# Patient Record
Sex: Female | Born: 1980 | Race: White | Hispanic: Refuse to answer | Marital: Single | Smoking: Former smoker
Health system: Southern US, Community
[De-identification: ages and names within clinical notes are randomized; demographics above are authoritative.]

## PROBLEM LIST (undated history)

## (undated) ENCOUNTER — Inpatient Hospital Stay: Payer: Self-pay

## (undated) DIAGNOSIS — K5090 Crohn's disease, unspecified, without complications: Secondary | ICD-10-CM

## (undated) DIAGNOSIS — N159 Renal tubulo-interstitial disease, unspecified: Secondary | ICD-10-CM

## (undated) DIAGNOSIS — K5790 Diverticulosis of intestine, part unspecified, without perforation or abscess without bleeding: Secondary | ICD-10-CM

---
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
## 2005-02-07 ENCOUNTER — Inpatient Hospital Stay: Payer: Self-pay | Admitting: Unknown Physician Specialty

## 2005-02-14 ENCOUNTER — Emergency Department: Payer: Self-pay | Admitting: Emergency Medicine

## 2005-12-06 ENCOUNTER — Emergency Department: Payer: Self-pay | Admitting: Unknown Physician Specialty

## 2008-07-06 ENCOUNTER — Emergency Department: Payer: Self-pay | Admitting: Emergency Medicine

## 2013-01-08 ENCOUNTER — Emergency Department: Payer: Self-pay | Admitting: Emergency Medicine

## 2013-01-08 LAB — COMPREHENSIVE METABOLIC PANEL
Albumin: 3.4 g/dL (ref 3.4–5.0)
Alkaline Phosphatase: 87 U/L (ref 50–136)
BUN: 14 mg/dL (ref 7–18)
Bilirubin,Total: 0.2 mg/dL (ref 0.2–1.0)
Creatinine: 0.96 mg/dL (ref 0.60–1.30)
EGFR (African American): >60
Glucose: 81 mg/dL (ref 65–99)
Potassium: 3.9 mmol/L (ref 3.5–5.1)
SGOT(AST): 15 U/L (ref 15–37)
Sodium: 141 mmol/L (ref 136–145)

## 2013-01-08 LAB — URINALYSIS, COMPLETE
Bilirubin,UR: NEGATIVE
Glucose,UR: NEGATIVE mg/dL (ref 0–75)
Ketone: NEGATIVE
Nitrite: NEGATIVE
RBC,UR: <1 /HPF (ref 0–5)
Specific Gravity: 1.021 (ref 1.003–1.030)
WBC UR: <1 /HPF (ref 0–5)

## 2013-01-08 LAB — CBC
HCT: 38.4 % (ref 35.0–47.0)
HGB: 12.7 g/dL (ref 12.0–16.0)
MCH: 30.8 pg (ref 26.0–34.0)
MCHC: 33.2 g/dL (ref 32.0–36.0)
Platelet: 268 10*3/uL (ref 150–440)
RBC: 4.13 10*6/uL (ref 3.80–5.20)

## 2013-01-09 LAB — PREGNANCY, URINE: Pregnancy Test, Urine: NEGATIVE m[IU]/mL

## 2013-04-01 ENCOUNTER — Emergency Department: Payer: Self-pay | Admitting: Emergency Medicine

## 2013-04-01 LAB — CBC
MCH: 31.4 pg (ref 26.0–34.0)
MCHC: 33.7 g/dL (ref 32.0–36.0)
MCV: 93 fL (ref 80–100)
RBC: 4.18 10*6/uL (ref 3.80–5.20)
RDW: 13.1 % (ref 11.5–14.5)

## 2013-04-01 LAB — URINALYSIS, COMPLETE
Blood: NEGATIVE
Ketone: NEGATIVE
Leukocyte Esterase: NEGATIVE
Nitrite: NEGATIVE
RBC,UR: <1 /HPF (ref 0–5)
Squamous Epithelial: 1
WBC UR: 7 /HPF (ref 0–5)

## 2013-04-01 LAB — COMPREHENSIVE METABOLIC PANEL
Albumin: 3.5 g/dL (ref 3.4–5.0)
Anion Gap: 7 (ref 7–16)
BUN: 13 mg/dL (ref 7–18)
Calcium, Total: 8.8 mg/dL (ref 8.5–10.1)
Creatinine: 0.88 mg/dL (ref 0.60–1.30)
Sodium: 137 mmol/L (ref 136–145)
Total Protein: 7.1 g/dL (ref 6.4–8.2)

## 2013-04-01 LAB — HCG, QUANTITATIVE, PREGNANCY: Beta Hcg, Quant.: 37421 m[IU]/mL — ABNORMAL HIGH

## 2013-04-03 LAB — URINE CULTURE

## 2013-04-21 ENCOUNTER — Emergency Department: Payer: Self-pay | Admitting: Emergency Medicine

## 2013-04-21 LAB — CBC
HCT: 35.6 % (ref 35.0–47.0)
HGB: 12.4 g/dL (ref 12.0–16.0)
MCHC: 34.8 g/dL (ref 32.0–36.0)
MCV: 93 fL (ref 80–100)
Platelet: 269 10*3/uL (ref 150–440)
RDW: 13.2 % (ref 11.5–14.5)
WBC: 7.6 10*3/uL (ref 3.6–11.0)

## 2013-04-21 LAB — URINALYSIS, COMPLETE
Bilirubin,UR: NEGATIVE
Glucose,UR: NEGATIVE mg/dL (ref 0–75)
Ketone: NEGATIVE
Leukocyte Esterase: NEGATIVE
Nitrite: NEGATIVE
Ph: 8 (ref 4.5–8.0)
RBC,UR: <1 /HPF (ref 0–5)
Specific Gravity: 1.011 (ref 1.003–1.030)
WBC UR: 1 /HPF (ref 0–5)

## 2013-04-21 LAB — COMPREHENSIVE METABOLIC PANEL
Albumin: 3.1 g/dL — ABNORMAL LOW (ref 3.4–5.0)
Anion Gap: 8 (ref 7–16)
BUN: 8 mg/dL (ref 7–18)
Chloride: 105 mmol/L (ref 98–107)
Co2: 24 mmol/L (ref 21–32)
Creatinine: 0.52 mg/dL — ABNORMAL LOW (ref 0.60–1.30)
EGFR (African American): >60
EGFR (Non-African Amer.): >60
Glucose: 79 mg/dL (ref 65–99)
Total Protein: 6.5 g/dL (ref 6.4–8.2)

## 2013-04-21 LAB — GC/CHLAMYDIA PROBE AMP

## 2013-06-17 DIAGNOSIS — O09899 Supervision of other high risk pregnancies, unspecified trimester: Secondary | ICD-10-CM | POA: Insufficient documentation

## 2013-08-14 DIAGNOSIS — E669 Obesity, unspecified: Secondary | ICD-10-CM | POA: Insufficient documentation

## 2013-09-04 ENCOUNTER — Observation Stay: Payer: Self-pay | Admitting: Obstetrics and Gynecology

## 2013-09-04 LAB — URINALYSIS, COMPLETE
Bilirubin,UR: NEGATIVE
Blood: NEGATIVE
GLUCOSE, UR: NEGATIVE mg/dL (ref 0–75)
Ketone: NEGATIVE
Leukocyte Esterase: NEGATIVE
Nitrite: NEGATIVE
PROTEIN: NEGATIVE
Ph: 7 (ref 4.5–8.0)
RBC,UR: <1 /HPF (ref 0–5)
Specific Gravity: 1.01 (ref 1.003–1.030)
Squamous Epithelial: 1

## 2014-12-17 ENCOUNTER — Encounter: Payer: Self-pay | Admitting: Emergency Medicine

## 2014-12-17 ENCOUNTER — Emergency Department
Admission: EM | Admit: 2014-12-17 | Discharge: 2014-12-18 | Disposition: A | Discharge status: Home / Self Care | Payer: Medicaid Other | Attending: Emergency Medicine | Admitting: Emergency Medicine

## 2014-12-17 DIAGNOSIS — Z720 Tobacco use: Secondary | ICD-10-CM | POA: Insufficient documentation

## 2014-12-17 DIAGNOSIS — L089 Local infection of the skin and subcutaneous tissue, unspecified: Secondary | ICD-10-CM | POA: Diagnosis not present

## 2014-12-17 DIAGNOSIS — L988 Other specified disorders of the skin and subcutaneous tissue: Secondary | ICD-10-CM | POA: Diagnosis present

## 2014-12-17 DIAGNOSIS — B9689 Other specified bacterial agents as the cause of diseases classified elsewhere: Secondary | ICD-10-CM

## 2014-12-17 DIAGNOSIS — R110 Nausea: Secondary | ICD-10-CM | POA: Diagnosis not present

## 2014-12-17 MED ORDER — SULFAMETHOXAZOLE-TRIMETHOPRIM 800-160 MG PO TABS: 1.0000 | ORAL_TABLET | Freq: Two times a day (BID) | ORAL | Status: DC

## 2014-12-17 MED ORDER — HYDROMORPHONE HCL 1 MG/ML IJ SOLN
1.0000 mg | Freq: Once | INTRAMUSCULAR | Status: AC
  Administered 2014-12-17: 1 mg via INTRAMUSCULAR

## 2014-12-17 MED ORDER — SULFAMETHOXAZOLE-TRIMETHOPRIM 800-160 MG PO TABS
1.0000 | ORAL_TABLET | Freq: Once | ORAL | Status: AC
  Administered 2014-12-17: 1 via ORAL

## 2014-12-17 MED ORDER — SULFAMETHOXAZOLE-TRIMETHOPRIM 800-160 MG PO TABS
ORAL_TABLET | ORAL | Status: AC
Start: 2014-12-17 — End: 2014-12-17
  Administered 2014-12-17: 1 via ORAL
  Filled 2014-12-17: qty 1

## 2014-12-17 MED ORDER — OXYCODONE-ACETAMINOPHEN 7.5-325 MG PO TABS: 1.0000 | ORAL_TABLET | ORAL | Status: AC | PRN

## 2014-12-17 MED ORDER — HYDROMORPHONE HCL 1 MG/ML IJ SOLN
INTRAMUSCULAR | Status: AC
  Administered 2014-12-17: 1 mg via INTRAMUSCULAR
  Filled 2014-12-17: qty 1

## 2014-12-17 NOTE — ED Provider Notes (Signed)
[facility] Emergency Department Provider Note  ____________________________________________  Time seen: Approximately 11:22 PM  I have reviewed the triage vital signs and the nursing notes.   HISTORY  Chief Complaint Abscess    HPI Penny Hardy is a 34 y.o. female complain of a nausea lesion medial aspect the left thigh. Patient stated onset yesterday with increasing pain and redness. Patient denies any discharge from the area. Patient denies any fever. Denies any inguinal adenopathy. Patient is rating the pain as a 9/10. Patient state increased pain with ambulation secondary to irritation from clothing and skin. Patient stated no palliative measures taken for this complaint.   No past medical history on file.  There are no active problems to display for this patient.   No past surgical history on file.  No current outpatient prescriptions on file.  Allergies Asa  No family history on file.  Social History History  Substance Use Topics  . Smoking status: Current Every Day Smoker    Types: Cigarettes  . Smokeless tobacco: Not on file  . Alcohol Use: No    Review of Systems Constitutional: No fever/chills Eyes: No visual changes. ENT: No sore throat. Cardiovascular: Denies chest pain. Respiratory: Denies shortness of breath. Gastrointestinal: No abdominal pain.  No nausea, no vomiting.  No diarrhea.  No constipation. Genitourinary: Negative for dysuria. Musculoskeletal: Negative for back pain. Skin: Nausea lesion medial left thigh. Neurological: Negative for headaches, focal weakness or numbness.  10-point ROS otherwise negative.  ____________________________________________   PHYSICAL EXAM:  VITAL SIGNS: ED Triage Vitals  Enc Vitals Group     BP 12/17/14 2153 138/88 mmHg     Pulse Rate 12/17/14 2153 94     Resp 12/17/14 2153 18     Temp 12/17/14 2153 97.5 F (36.4 C)     Temp src --      SpO2 12/17/14 2153 98 %   Weight 12/17/14 2153 195 lb (88.451 kg)     Height 12/17/14 2153 5' 2"  (1.575 m)     Head Cir --      Peak Flow --      Pain Score 12/17/14 2154 9     Pain Loc --      Pain Edu? --      Excl. in [place]? --     Constitutional: Alert and oriented. En route distress Eyes: Conjunctivae are normal. PERRL. EOMI. Head: Atraumatic. Nose: No congestion/rhinnorhea. Mouth/Throat: Mucous membranes are moist.  Oropharynx non-erythematous. Neck: No stridor.   Hematological/Lymphatic/Immunilogical: No cervical lymphadenopathy. Cardiovascular: Normal rate, regular rhythm. Grossly normal heart sounds.  Good peripheral circulation. Respiratory: Normal respiratory effort.  No retractions. Lungs CTAB. Gastrointestinal: Soft and nontender. No distention. No abdominal bruits. No CVA tenderness. Musculoskeletal: No lower extremity tenderness nor edema.  No joint effusions. Neurologic:  Normal speech and language. No gross focal neurologic deficits are appreciated. Speech is normal. No gait instability. Skin:  Skin is warm, dry and intact. No rash noted. Erythematous nodule lesion medial left thigh. Tender to palpation. No drainage. Area is nonfluctuant. Psychiatric: Mood and affect are normal. Speech and behavior are normal.  ____________________________________________   LABS (all labs ordered are listed, but only abnormal results are displayed)  Labs Reviewed - No data to display ____________________________________________  EKG   ____________________________________________  RADIOLOGY   ____________________________________________   PROCEDURES  Procedure(s) performed: None  Critical Care performed: No  ____________________________________________   INITIAL IMPRESSION / ASSESSMENT AND PLAN / ED COURSE  Pertinent labs & imaging results  that were available during my care of the patient were reviewed by me and considered in my medical decision making (see chart for details).  Bacterial  skin infection. ____________________________________________   FINAL CLINICAL IMPRESSION(S) / ED DIAGNOSES  Final diagnoses:  Skin infection, bacterial       Sable Feil, PA-C 12/17/14 7915  Lisa Roca, MD 12/17/14 [phone]

## 2014-12-17 NOTE — ED Notes (Signed)
Patient states that she had a "knot" that developed to her left groin yesterday.

## 2014-12-18 MED ORDER — ONDANSETRON 4 MG PO TBDP
4.0000 mg | ORAL_TABLET | Freq: Once | ORAL | Status: AC
Start: 2014-12-18 — End: 2014-12-18
  Administered 2014-12-18: 4 mg via ORAL

## 2014-12-18 MED ORDER — ONDANSETRON 4 MG PO TBDP
ORAL_TABLET | ORAL | Status: AC
Start: 2014-12-18 — End: 2014-12-18
  Administered 2014-12-18: 4 mg via ORAL
  Filled 2014-12-18: qty 1

## 2014-12-18 NOTE — ED Notes (Signed)
Patient with no complaints at this time. Respirations even and unlabored. Skin warm/dry. Discharge instructions reviewed with patient at this time. Patient given opportunity to voice concerns/ask questions. Patient discharged at this time and left Emergency Department, via wheelchair.

## 2014-12-18 NOTE — ED Notes (Signed)
Pt states nausea sx continue. Informed pt of current pain medication side effects and MD awareness of current sx. Pt acknowledged and wants to leave ER medical facility at this time.

## 2014-12-18 NOTE — ED Notes (Signed)
Made MD aware of pt feeling nauseated at this time. MD acknowledged and received new orders.

## 2015-04-01 ENCOUNTER — Emergency Department
Admission: EM | Admit: 2015-04-01 | Discharge: 2015-04-01 | Disposition: A | Discharge status: Home / Self Care | Payer: Medicaid Other | Attending: Emergency Medicine | Admitting: Emergency Medicine

## 2015-04-01 ENCOUNTER — Emergency Department: Payer: Medicaid Other

## 2015-04-01 ENCOUNTER — Encounter: Payer: Self-pay | Admitting: Emergency Medicine

## 2015-04-01 DIAGNOSIS — O039 Complete or unspecified spontaneous abortion without complication: Secondary | ICD-10-CM | POA: Insufficient documentation

## 2015-04-01 DIAGNOSIS — Z87891 Personal history of nicotine dependence: Secondary | ICD-10-CM | POA: Insufficient documentation

## 2015-04-01 DIAGNOSIS — Z79899 Other long term (current) drug therapy: Secondary | ICD-10-CM | POA: Diagnosis not present

## 2015-04-01 DIAGNOSIS — Z3A01 Less than 8 weeks gestation of pregnancy: Secondary | ICD-10-CM | POA: Insufficient documentation

## 2015-04-01 DIAGNOSIS — O209 Hemorrhage in early pregnancy, unspecified: Secondary | ICD-10-CM | POA: Diagnosis present

## 2015-04-01 LAB — ABO/RH: ABO/RH(D): A POS

## 2015-04-01 LAB — HCG, QUANTITATIVE, PREGNANCY: hCG, Beta Chain, Quant, S: 38157 m[IU]/mL — ABNORMAL HIGH (ref <5)

## 2015-04-01 LAB — CBC
HCT: 39.2 % (ref 35.0–47.0)
Hemoglobin: 13 g/dL (ref 12.0–16.0)
MCH: 30.6 pg (ref 26.0–34.0)
MCHC: 33.2 g/dL (ref 32.0–36.0)
MCV: 92.2 fL (ref 80.0–100.0)
PLATELETS: 253 10*3/uL (ref 150–440)
RBC: 4.25 MIL/uL (ref 3.80–5.20)
RDW: 13.4 % (ref 11.5–14.5)
WBC: 7.8 10*3/uL (ref 3.6–11.0)

## 2015-04-01 MED ORDER — HYDROCODONE-ACETAMINOPHEN 5-325 MG PO TABS: 1.0000 | ORAL_TABLET | ORAL | Status: DC | PRN

## 2015-04-01 MED ORDER — HYDROCODONE-ACETAMINOPHEN 5-325 MG PO TABS
1.0000 | ORAL_TABLET | Freq: Once | ORAL | Status: AC
  Administered 2015-04-01: 1 via ORAL
  Filled 2015-04-01: qty 1

## 2015-04-01 NOTE — ED Provider Notes (Signed)
[facility] Emergency Department Provider Note  Time seen: 9:48 AM  I have reviewed the triage vital signs and the nursing notes.   HISTORY  Chief Complaint Vaginal Bleeding    HPI Penny Hardy is a 34 y.o. female G5 P3 A1 presents the emergency department approximately 5-[redacted] weeks pregnant with vaginal bleeding. According to the patient her last period was 02/17/15. This morning she awoke with vaginal bleeding which she describes as being heavier than a period. Patient also passed what appears to be tissue which she brought to the emergency department. Patient denies any abdominal pain.     History reviewed. No pertinent past medical history.  There are no active problems to display for this patient.   No past surgical history on file.  Current Outpatient Rx  Name  Route  Sig  Dispense  Refill  . oxyCODONE-acetaminophen (PERCOCET) 7.5-325 MG per tablet   Oral   Take 1 tablet by mouth every 4 (four) hours as needed for severe pain.   20 tablet   0   . sulfamethoxazole-trimethoprim (BACTRIM DS,SEPTRA DS) 800-160 MG per tablet   Oral   Take 1 tablet by mouth 2 (two) times daily.   20 tablet   0     Allergies Asa  No family history on file.  Social History Social History  Substance Use Topics  . Smoking status: Former Smoker    Types: Cigarettes  . Smokeless tobacco: None  . Alcohol Use: No    Review of Systems Constitutional: Negative for fever. Cardiovascular: Negative for chest pain. Respiratory: Negative for shortness of breath. Gastrointestinal: Negative for abdominal pain Genitourinary: Positive for vaginal bleeding. Neurological: Negative for headache 10-point ROS otherwise negative.  ____________________________________________   PHYSICAL EXAM:  VITAL SIGNS: ED Triage Vitals  Enc Vitals Group     BP 04/01/15 0914 109/59 mmHg     Pulse Rate 04/01/15 0914 97     Resp 04/01/15 0914 16     Temp 04/01/15 0914 97.5 F  (36.4 C)     Temp Source 04/01/15 0914 Oral     SpO2 04/01/15 0914 97 %     Weight 04/01/15 0914 190 lb (86.183 kg)     Height 04/01/15 0914 5' 2"  (1.575 m)     Head Cir --      Peak Flow --      Pain Score 04/01/15 0914 10     Pain Loc --      Pain Edu? --      Excl. in [place]? --     Constitutional: Alert and oriented. Well appearing and in no distress. Eyes: Normal exam ENT   Mouth/Throat: Mucous membranes are moist. Cardiovascular: Normal rate, regular rhythm. No murmur Respiratory: Normal respiratory effort without tachypnea nor retractions. Breath sounds are clear and equal bilaterally. No wheezes/rales/rhonchi. Gastrointestinal: Soft and nontender. No distention.   Musculoskeletal: Nontender with normal range of motion in all extremities. Neurologic:  Normal speech and language. No gross focal neurologic deficits Psychiatric: Mood and affect are normal. Speech and behavior are normal. Patient exhibits appropriate insight and judgment.  ____________________________________________   RADIOLOGY  Ultrasound shows no gestational sac or fetus.  ____________________________________________   INITIAL IMPRESSION / ASSESSMENT AND PLAN / ED COURSE  Pertinent labs & imaging results that were available during my care of the patient were reviewed by me and considered in my medical decision making (see chart for details).  Patient is approximately 5-[redacted] weeks pregnant who presents with vaginal  bleeding. We will check labs and obtain an ultrasound. Patient did appear to pass tissue which she brought to the emergency department, which is possibly consistent with gestational sac. Patient denies any abdominal pain. Nontender abdominal exam.  Ultrasound shows no gestational sac or fetus, labs show beta hCG 38,000, patient is a positive blood type. Given the patient's heavy bleeding with tissue passage which appears to resemble a gestational sign, and an ultrasound showing no gestational  sac, likely miscarriage. We will have patient follow-up with GYN for repeat beta hCG testing.  ____________________________________________   FINAL CLINICAL IMPRESSION(S) / ED DIAGNOSES  Miscarriage  Harvest Dark, MD 04/01/15 1227

## 2015-04-01 NOTE — Discharge Instructions (Signed)
Please follow-up with your OB/GYN or primary care physician for repeat beta hCG testing in 2-3 days. Today's beta hCG is 38,000. Return to the emergency department for any abdominal pain, lightheadedness/dizziness, or any other symptom personally concerning to yourself.   Miscarriage A miscarriage is the loss of an unborn baby (fetus) before the 20th week of pregnancy. The cause is often unknown.  HOME CARE  You may need to stay in bed (bed rest), or you may be able to do light activity. Go about activity as told by your doctor.  Have help at home.  Write down how many pads you use each day. Write down how soaked they are.  Do not use tampons. Do not wash out your vagina (douche) or have sex (intercourse) until your doctor approves.  Only take medicine as told by your doctor.  Do not take aspirin.  Keep all doctor visits as told.  If you or your partner have problems with grieving, talk to your doctor. You can also try counseling. Give yourself time to grieve before trying to get pregnant again. GET HELP RIGHT AWAY IF:  You have bad cramps or pain in your back or belly (abdomen).  You have a fever.  You pass large clumps of blood (clots) from your vagina that are walnut-sized or larger. Save the clumps for your doctor to see.  You pass large amounts of tissue from your vagina. Save the tissue for your doctor to see.  You have more bleeding.  You have thick, bad-smelling fluid (discharge) coming from the vagina.  You get lightheaded, weak, or you pass out (faint).  You have chills. MAKE SURE YOU:  Understand these instructions.  Will watch your condition.  Will get help right away if you are not doing well or get worse. Document Released: 09/18/2011 Document Reviewed: 09/18/2011 [facility] Patient Information 2015 [place]. This information is not intended to replace advice given to you by your health care provider. Make sure you discuss any questions you have with  your health care provider.

## 2015-04-01 NOTE — ED Notes (Signed)
Per Patient bleeding started around 7:30am this morning.  Per patient she passed about 2-3 large red clots. Last menstral period estimated per patient 02/17/15.

## 2015-04-01 NOTE — ED Notes (Signed)
[redacted] weeks pregnant.  Started bleeding this am.

## 2016-03-21 ENCOUNTER — Telehealth: Payer: Self-pay | Admitting: Primary Care

## 2016-03-21 ENCOUNTER — Other Ambulatory Visit: Payer: Self-pay | Admitting: Primary Care

## 2016-03-21 DIAGNOSIS — R1084 Generalized abdominal pain: Secondary | ICD-10-CM

## 2016-03-21 NOTE — Telephone Encounter (Signed)
We tried to contact the patient with the number we have on file of [phone] but this number is invalid. We need a valid telephone number so that we can schedule the patient.

## 2016-04-04 ENCOUNTER — Ambulatory Visit: Payer: Medicaid Other

## 2016-05-14 DIAGNOSIS — Z98891 History of uterine scar from previous surgery: Secondary | ICD-10-CM | POA: Insufficient documentation

## 2016-05-21 ENCOUNTER — Emergency Department
Admission: EM | Admit: 2016-05-21 | Discharge: 2016-05-22 | Disposition: A | Discharge status: Home / Self Care | Payer: Medicaid Other | Attending: Emergency Medicine | Admitting: Emergency Medicine

## 2016-05-21 DIAGNOSIS — O200 Threatened abortion: Secondary | ICD-10-CM | POA: Diagnosis not present

## 2016-05-21 DIAGNOSIS — Z3A09 9 weeks gestation of pregnancy: Secondary | ICD-10-CM | POA: Insufficient documentation

## 2016-05-21 DIAGNOSIS — O209 Hemorrhage in early pregnancy, unspecified: Secondary | ICD-10-CM | POA: Diagnosis present

## 2016-05-21 DIAGNOSIS — Z79899 Other long term (current) drug therapy: Secondary | ICD-10-CM | POA: Diagnosis not present

## 2016-05-21 DIAGNOSIS — Z87891 Personal history of nicotine dependence: Secondary | ICD-10-CM | POA: Insufficient documentation

## 2016-05-22 ENCOUNTER — Emergency Department: Payer: Medicaid Other

## 2016-05-22 LAB — ABO/RH: ABO/RH(D): A POS

## 2016-05-22 LAB — CBC WITH DIFFERENTIAL/PLATELET
Basophils Absolute: 0.1 10*3/uL (ref 0–0.1)
Basophils Relative: 1 %
Eosinophils Absolute: 0.2 10*3/uL (ref 0–0.7)
Eosinophils Relative: 2 %
HCT: 37.1 % (ref 35.0–47.0)
HEMOGLOBIN: 12.4 g/dL (ref 12.0–16.0)
LYMPHS ABS: 1.7 10*3/uL (ref 1.0–3.6)
Lymphocytes Relative: 19 %
MCH: 30.4 pg (ref 26.0–34.0)
MCHC: 33.5 g/dL (ref 32.0–36.0)
MCV: 91 fL (ref 80.0–100.0)
Monocytes Absolute: 0.8 10*3/uL (ref 0.2–0.9)
Monocytes Relative: 9 %
NEUTROS PCT: 69 %
Neutro Abs: 6.1 10*3/uL (ref 1.4–6.5)
Platelets: 276 10*3/uL (ref 150–440)
RBC: 4.07 MIL/uL (ref 3.80–5.20)
RDW: 14.8 % — ABNORMAL HIGH (ref 11.5–14.5)
WBC: 8.9 10*3/uL (ref 3.6–11.0)

## 2016-05-22 LAB — HCG, QUANTITATIVE, PREGNANCY: hCG, Beta Chain, Quant, S: 61066 m[IU]/mL — ABNORMAL HIGH (ref <5)

## 2016-05-22 LAB — POCT PREGNANCY, URINE: PREG TEST UR: POSITIVE — AB

## 2016-05-22 NOTE — ED Provider Notes (Signed)
[facility] Emergency Department Provider Note  ____________________________________________   First MD Initiated Contact with Patient 05/22/16 0009     (approximate)  I have reviewed the triage vital signs and the nursing notes.   HISTORY  Chief Complaint Abdominal Pain; Back Pain; and Vaginal Bleeding    HPI Penny Hardy is a 35 y.o. female [phone] at approximately 33 weeks' gestation who presents for evaluation of scant vaginal bleeding.  She states it started acutely tonight when she noticed it on the toilet paper several times after she urinated.  She states there is also a little bit of blood in the bowl.  She is not actively bleeding when she is not urinating or wiping.  She has had mild intermittent cramps in her lower abdomen but that is not acutely different from the last several weeks.  She denies fever/chills, chest pain, shortness of breath, nausea, vomiting, acute abdominal pain.  She reports that she went to [facility] about 2 weeks ago for a prenatal visit.  She states that they did an ultrasound which showed a gestational sac but no fetus.  She is scheduled to return in 4 days for repeat visit a repeat ultrasound.  She was not having any bleeding at that time.  She reports that symptoms are mild and nothing makes it better or worse but she is concerned given that she has had 1 previous miscarriage.     History reviewed. No pertinent past medical history.  There are no active problems to display for this patient.   History reviewed. No pertinent surgical history.  Prior to Admission medications   Medication Sig Start Date End Date Taking? Authorizing Provider  HYDROcodone-acetaminophen (NORCO/VICODIN) 5-325 MG per tablet Take 1 tablet by mouth every 4 (four) hours as needed for moderate pain. 04/01/15   Harvest Dark, MD  sulfamethoxazole-trimethoprim (BACTRIM DS,SEPTRA DS) 800-160 MG per tablet Take 1 tablet by mouth 2 (two) times daily.  12/17/14   Sable Feil, PA-C    Allergies Asa [aspirin]  No family history on file.  Social History Social History  Substance Use Topics  . Smoking status: Former Smoker    Types: Cigarettes  . Smokeless tobacco: Not on file  . Alcohol use No    Review of Systems Constitutional: No fever/chills Eyes: No visual changes. ENT: No sore throat. Cardiovascular: Denies chest pain. Respiratory: Denies shortness of breath. Gastrointestinal: No abdominal pain.  No nausea, no vomiting.  No diarrhea.  No constipation. Genitourinary: Scant vaginal bleeding at about [redacted] weeks gestation.  Negative for dysuria. Musculoskeletal: Negative for back pain. Skin: Negative for rash. Neurological: Negative for headaches, focal weakness or numbness.  10-point ROS otherwise negative.  ____________________________________________   PHYSICAL EXAM:  VITAL SIGNS: ED Triage Vitals  Enc Vitals Group     BP 05/21/16 2331 114/71     Pulse Rate 05/21/16 2331 87     Resp 05/21/16 2331 14     Temp 05/21/16 2331 97.8 F (36.6 C)     Temp Source 05/21/16 2331 Oral     SpO2 05/21/16 2331 97 %     Weight 05/21/16 2332 230 lb (104.3 kg)     Height 05/21/16 2332 5' 1"  (1.549 m)     Head Circumference --      Peak Flow --      Pain Score 05/21/16 2332 6     Pain Loc --      Pain Edu? --      Excl.  in [place]? --     Constitutional: Alert and oriented. Well appearing and in no acute distress. Eyes: Conjunctivae are normal. PERRL. EOMI. Head: Atraumatic. Nose: No congestion/rhinnorhea. Mouth/Throat: Mucous membranes are moist.  Oropharynx non-erythematous. Neck: No stridor.  No meningeal signs.   Cardiovascular: Normal rate, regular rhythm. Good peripheral circulation. Grossly normal heart sounds. Respiratory: Normal respiratory effort.  No retractions. Lungs CTAB. Gastrointestinal: Soft and nontender. No distention.  Genitourinary: deferred Musculoskeletal: No lower extremity tenderness nor edema. No  gross deformities of extremities. Neurologic:  Normal speech and language. No gross focal neurologic deficits are appreciated.  Skin:  Skin is warm, dry and intact. No rash noted. Psychiatric: Mood and affect are normal. Speech and behavior are normal.  ____________________________________________   LABS (all labs ordered are listed, but only abnormal results are displayed)  Labs Reviewed  HCG, QUANTITATIVE, PREGNANCY - Abnormal; Notable for the following:       Result Value   hCG, Beta Chain, Quant, S 61,066 (*)    All other components within normal limits  CBC WITH DIFFERENTIAL/PLATELET - Abnormal; Notable for the following:    RDW 14.8 (*)    All other components within normal limits  POCT PREGNANCY, URINE - Abnormal; Notable for the following:    Preg Test, Ur POSITIVE (*)    All other components within normal limits  ABO/RH   ____________________________________________  EKG  EKG not ordered ____________________________________________  RADIOLOGY   US Ob Comp Less 14 Wks  Result Date: 05/22/2016 CLINICAL DATA:  Acute onset of vaginal bleeding.  Initial encounter. EXAM: OBSTETRIC <14 WK [place] AND TRANSVAGINAL OB US TECHNIQUE: Both transabdominal and transvaginal ultrasound examinations were performed for complete evaluation of the gestation as well as the maternal uterus, adnexal regions, and pelvic cul-de-sac. Transvaginal technique was performed to assess early pregnancy. COMPARISON:  Pelvic ultrasound performed 04/01/2015 FINDINGS: Intrauterine gestational sac: Single; visualized and normal in shape. Yolk sac:  Yes Embryo:  Yes Cardiac Activity: Yes Heart Rate: 162  bpm CRL:  2.2 cm   8 w   6 d                  [place] EDC: 12/26/2016 Subchorionic hemorrhage:  None visualized. Maternal uterus/adnexae: The ovaries are unremarkable in appearance. The right ovary measures 3.7 x 1.6 x 3.0 cm, while the left ovary measures 4.5 x 2.7 x 3.0 cm. No suspicious adnexal masses are seen; there  is no evidence for ovarian torsion. No free fluid is seen within the pelvic cul-de-sac. IMPRESSION: Single live intrauterine pregnancy noted, with a crown-rump length of 2.2 cm, corresponding to a gestational age of [redacted] weeks 6 days. This matches the gestational age of [redacted] weeks 0 days by LMP, reflecting an estimated date of delivery of January 01, 2017. Electronically Signed   By: Garald Balding M.D.   On: 05/22/2016 01:17   US Ob Transvaginal  Result Date: 05/22/2016 CLINICAL DATA:  Acute onset of vaginal bleeding.  Initial encounter. EXAM: OBSTETRIC <14 WK [place] AND TRANSVAGINAL OB US TECHNIQUE: Both transabdominal and transvaginal ultrasound examinations were performed for complete evaluation of the gestation as well as the maternal uterus, adnexal regions, and pelvic cul-de-sac. Transvaginal technique was performed to assess early pregnancy. COMPARISON:  Pelvic ultrasound performed 04/01/2015 FINDINGS: Intrauterine gestational sac: Single; visualized and normal in shape. Yolk sac:  Yes Embryo:  Yes Cardiac Activity: Yes Heart Rate: 162  bpm CRL:  2.2 cm   8 w   6 d  [place] EDC: 12/26/2016 Subchorionic hemorrhage:  None visualized. Maternal uterus/adnexae: The ovaries are unremarkable in appearance. The right ovary measures 3.7 x 1.6 x 3.0 cm, while the left ovary measures 4.5 x 2.7 x 3.0 cm. No suspicious adnexal masses are seen; there is no evidence for ovarian torsion. No free fluid is seen within the pelvic cul-de-sac. IMPRESSION: Single live intrauterine pregnancy noted, with a crown-rump length of 2.2 cm, corresponding to a gestational age of [redacted] weeks 6 days. This matches the gestational age of [redacted] weeks 0 days by LMP, reflecting an estimated date of delivery of January 01, 2017. Electronically Signed   By: Garald Balding M.D.   On: 05/22/2016 01:17    ____________________________________________   PROCEDURES  Procedure(s) performed:   Procedures   Critical Care performed:  No ____________________________________________   INITIAL IMPRESSION / ASSESSMENT AND PLAN / ED COURSE  Pertinent labs & imaging results that were available during my care of the patient were reviewed by me and considered in my medical decision making (see chart for details).  ABO/Rh, HCG, CBC all pending.  U-preg is positive.  Proceeding with early pregnancy vaginal bleeding ultrasound panel.  VSS, no acute distress, no pain.   Clinical Course as of May 22 225  Mon May 22, 2016  0137 Reassuring ultrasound w/ single IUP w/ normal cardiac activity. US OB Comp Less 14 Wks [CF]  0226 Discussed with patient.  Encouraged close outpatient follow up with OB at [facility].  I gave my usual and customary return precautions.   [CF]    Clinical Course User Index [CF] Hinda Kehr, MD    ____________________________________________  FINAL CLINICAL IMPRESSION(S) / ED DIAGNOSES  Final diagnoses:  Threatened miscarriage in early pregnancy     MEDICATIONS GIVEN DURING THIS VISIT:  Medications - No data to display   NEW OUTPATIENT MEDICATIONS STARTED DURING THIS VISIT:  New Prescriptions   No medications on file    Modified Medications   No medications on file    Discontinued Medications   No medications on file     Note:  This document was prepared using Dragon voice recognition software and may include unintentional dictation errors.    Hinda Kehr, MD 05/22/16 [phone]

## 2016-05-22 NOTE — ED Notes (Signed)
Patient tolerated gingerale and crackers. States nausea has resolved.

## 2016-05-22 NOTE — Discharge Instructions (Signed)
You have been seen in the Emergency Department (ED) for vaginal bleeding during pregnancy, which is called a ?threatened abortion?.  Fortunately, your evaluation was reassuring, and the ultrasound showed a normal pregnancy.  Please start taking prenatal vitamins (over the counter) if you are not already doing so.  As a result of your blood type, you did not receive an injection of medication called Rhogam - please let your OB/Gyn know.  Please follow up as recommended above.  If you develop any other symptoms that concern you (including, but not limited to, persistent vomiting, worsening bleeding, abdominal or pelvic pain, or fever greater than 101), please return immediately to the Emergency Department.

## 2016-05-22 NOTE — ED Notes (Signed)
Patient requesting gingerale and crackers in addition to "some nausea medicine." Will notify MD.

## 2016-05-22 NOTE — ED Notes (Signed)
Patient returned from US.

## 2016-06-03 DIAGNOSIS — O09523 Supervision of elderly multigravida, third trimester: Secondary | ICD-10-CM | POA: Insufficient documentation

## 2016-06-05 ENCOUNTER — Emergency Department
Admission: EM | Admit: 2016-06-05 | Discharge: 2016-06-05 | Disposition: A | Discharge status: Home / Self Care | Payer: Medicaid Other | Attending: Emergency Medicine | Admitting: Emergency Medicine

## 2016-06-05 DIAGNOSIS — Z3A11 11 weeks gestation of pregnancy: Secondary | ICD-10-CM | POA: Insufficient documentation

## 2016-06-05 DIAGNOSIS — O99511 Diseases of the respiratory system complicating pregnancy, first trimester: Secondary | ICD-10-CM | POA: Diagnosis not present

## 2016-06-05 DIAGNOSIS — J069 Acute upper respiratory infection, unspecified: Secondary | ICD-10-CM | POA: Diagnosis not present

## 2016-06-05 DIAGNOSIS — Z87891 Personal history of nicotine dependence: Secondary | ICD-10-CM | POA: Diagnosis not present

## 2016-06-05 LAB — INFLUENZA PANEL BY PCR (TYPE A & B)
Influenza A By PCR: NEGATIVE
Influenza B By PCR: NEGATIVE

## 2016-06-05 MED ORDER — ACETAMINOPHEN 325 MG PO TABS
650.0000 mg | ORAL_TABLET | Freq: Once | ORAL | Status: AC
  Administered 2016-06-05: 650 mg via ORAL

## 2016-06-05 MED ORDER — ACETAMINOPHEN 325 MG PO TABS
ORAL_TABLET | ORAL | Status: AC
  Filled 2016-06-05: qty 2

## 2016-06-05 NOTE — ED Provider Notes (Signed)
[facility] Emergency Department Provider Note  ____________________________________________  Time seen: Approximately 3:04 PM  I have reviewed the triage vital signs and the nursing notes.   HISTORY  Chief Complaint URI   HPI Penny Hardy is a 35 y.o. female presents with 1 day of congestion and body aches. Patient was feeling fine yesterday but woke up this morning feeling congested. Patient has postnasal drip but denies sore throat. Patient is [redacted] weeks pregnant. Patient denies shortness of breath. chest pain, cough, nausea, abdominal pain. Patient has not taken anything for pain. Patient is allergic to aspirin.   History reviewed. No pertinent past medical history.  There are no active problems to display for this patient.   History reviewed. No pertinent surgical history.  Prior to Admission medications   Medication Sig Start Date End Date Taking? Authorizing Provider  HYDROcodone-acetaminophen (NORCO/VICODIN) 5-325 MG per tablet Take 1 tablet by mouth every 4 (four) hours as needed for moderate pain. 04/01/15   Harvest Dark, MD  sulfamethoxazole-trimethoprim (BACTRIM DS,SEPTRA DS) 800-160 MG per tablet Take 1 tablet by mouth 2 (two) times daily. 12/17/14   Sable Feil, PA-C    Allergies Asa [aspirin]  No family history on file.  Social History Social History  Substance Use Topics  . Smoking status: Former Smoker    Types: Cigarettes  . Smokeless tobacco: Never Used  . Alcohol use No    Review of Systems Constitutional: Negative fever/chills ENT: Negative sore throat. Cardiovascular: Denies chest pain. Respiratory: Negative shortness of breath. Negative for cough. Gastrointestinal: Negative nausea,  negative vomiting.  Negative diarrhea.  Musculoskeletal: Negative for body aches Skin: Negative for rash. Neurological: Negative for headaches ____________________________________________   PHYSICAL EXAM:  VITAL SIGNS: ED  Triage Vitals  Enc Vitals Group     BP 06/05/16 1336 (!) 109/57     Pulse Rate 06/05/16 1336 99     Resp 06/05/16 1336 18     Temp 06/05/16 1336 98.1 F (36.7 C)     Temp Source 06/05/16 1336 Oral     SpO2 06/05/16 1336 97 %     Weight --      Height --      Head Circumference --      Peak Flow --      Pain Score 06/05/16 1349 9     Pain Loc --      Pain Edu? --      Excl. in [place]? --     Constitutional: Alert and oriented. Well appearing and in no acute distress. Sleeping in room. Eyes: Conjunctivae are normal. EOMI. Ears: Tympanic membranes pearly gray with good landmarks. Nose: Moderate congestion; Mild rhinnorhea. No sinus tenderness. Mouth/Throat: Mucous membranes are moist.  Oropharynx non erythematous. Tonsils appear non enlarged. Neck: No stridor.  Lymphatic: No cervical lymphadenopathy. Not tender to palpation. Cardiovascular: Normal rate, regular rhythm. Grossly normal heart sounds.  Good peripheral circulation. Respiratory: Normal respiratory effort.  No retractions. Lungs CTAB. Gastrointestinal: Soft and nontender.  Musculoskeletal: FROM x 4 extremities.  Neurologic:  Normal speech and language.  Skin:  Skin is warm, dry and intact. No rash noted. Psychiatric: Mood and affect are normal. Speech and behavior are normal.  ____________________________________________   LABS (all labs ordered are listed, but only abnormal results are displayed)  Labs Reviewed  INFLUENZA PANEL BY PCR (TYPE A & B, H1N1)   ____________________________________________  EKG  RADIOLOGY  PROCEDURES   Critical Care performed: No  ____________________________________________   INITIAL IMPRESSION /  ASSESSMENT AND PLAN / ED COURSE  Clinical Course     Pertinent labs & imaging results that were available during my care of the patient were reviewed by me and considered in my medical decision making (see chart for details).   This is likely a viral upper respiratory infection.  Flu was negative. Fetal heart tones present. Patient was advised to take Tylenol and Zyrtec for symptoms. Risks of taking other cold medications during pregnancy were discussed. Patient was instructed to return to ED if symptoms persist or worsen. Patient was advised to follow up with PCP.  FINAL CLINICAL IMPRESSION(S) / ED DIAGNOSES  Final diagnoses:  Viral upper respiratory tract infection    Note:  This document was prepared using Dragon voice recognition software and may include unintentional dictation errors.    Laban Emperor, PA-C 06/05/16 1934    Lavonia Drafts, MD 06/05/16 2020

## 2016-06-05 NOTE — ED Notes (Signed)
fetal heart tones  132bpm

## 2016-06-05 NOTE — Discharge Instructions (Signed)
Patient can take tylenol or zyrtec as needed for symptoms. No other over the counter medications are recommended during pregnancy. Humidifier will aid with congestion. Flu is negative.

## 2016-06-05 NOTE — ED Triage Notes (Signed)
Pt states she woke with c/o chills, bodyaches, sinus congestion this morning.. Pt states she is approximately 11-[redacted] weeks pregnant.Penny Hardy

## 2016-06-28 ENCOUNTER — Emergency Department
Admission: EM | Admit: 2016-06-28 | Discharge: 2016-06-28 | Disposition: A | Discharge status: Home / Self Care | Payer: Medicaid Other | Attending: Emergency Medicine | Admitting: Emergency Medicine

## 2016-06-28 ENCOUNTER — Encounter: Payer: Self-pay | Admitting: Emergency Medicine

## 2016-06-28 ENCOUNTER — Emergency Department: Payer: Medicaid Other

## 2016-06-28 DIAGNOSIS — J069 Acute upper respiratory infection, unspecified: Secondary | ICD-10-CM

## 2016-06-28 DIAGNOSIS — M791 Myalgia, unspecified site: Secondary | ICD-10-CM

## 2016-06-28 DIAGNOSIS — Z87891 Personal history of nicotine dependence: Secondary | ICD-10-CM | POA: Diagnosis not present

## 2016-06-28 DIAGNOSIS — R0981 Nasal congestion: Secondary | ICD-10-CM | POA: Diagnosis present

## 2016-06-28 DIAGNOSIS — Z3A14 14 weeks gestation of pregnancy: Secondary | ICD-10-CM | POA: Insufficient documentation

## 2016-06-28 DIAGNOSIS — O99511 Diseases of the respiratory system complicating pregnancy, first trimester: Secondary | ICD-10-CM | POA: Insufficient documentation

## 2016-06-28 LAB — BASIC METABOLIC PANEL
ANION GAP: 8 (ref 5–15)
BUN: 6 mg/dL (ref 6–20)
CO2: 20 mmol/L — ABNORMAL LOW (ref 22–32)
Calcium: 8.7 mg/dL — ABNORMAL LOW (ref 8.9–10.3)
Chloride: 107 mmol/L (ref 101–111)
Creatinine, Ser: 0.41 mg/dL — ABNORMAL LOW (ref 0.44–1.00)
GFR calc Af Amer: >60 mL/min (ref >60)
Glucose, Bld: 102 mg/dL — ABNORMAL HIGH (ref 65–99)
POTASSIUM: 3.8 mmol/L (ref 3.5–5.1)
SODIUM: 135 mmol/L (ref 135–145)

## 2016-06-28 LAB — URINALYSIS, COMPLETE (UACMP) WITH MICROSCOPIC
BILIRUBIN URINE: NEGATIVE
Bacteria, UA: NONE SEEN
GLUCOSE, UA: NEGATIVE mg/dL
HGB URINE DIPSTICK: NEGATIVE
KETONES UR: NEGATIVE mg/dL
NITRITE: NEGATIVE
PROTEIN: NEGATIVE mg/dL
RBC / HPF: NONE SEEN RBC/hpf (ref 0–5)
Specific Gravity, Urine: 1.017 (ref 1.005–1.030)
pH: 7 (ref 5.0–8.0)

## 2016-06-28 LAB — CBC
HCT: 35.6 % (ref 35.0–47.0)
HEMOGLOBIN: 12.3 g/dL (ref 12.0–16.0)
MCH: 31.2 pg (ref 26.0–34.0)
MCHC: 34.6 g/dL (ref 32.0–36.0)
MCV: 90.2 fL (ref 80.0–100.0)
PLATELETS: 276 10*3/uL (ref 150–440)
RBC: 3.94 MIL/uL (ref 3.80–5.20)
RDW: 13.9 % (ref 11.5–14.5)
WBC: 8.9 10*3/uL (ref 3.6–11.0)

## 2016-06-28 LAB — INFLUENZA PANEL BY PCR (TYPE A & B)
INFLAPCR: NEGATIVE
Influenza B By PCR: NEGATIVE

## 2016-06-28 MED ORDER — SODIUM CHLORIDE 0.9 % IV BOLUS (SEPSIS)
1000.0000 mL | Freq: Once | INTRAVENOUS | Status: AC
  Administered 2016-06-28: 1000 mL via INTRAVENOUS

## 2016-06-28 NOTE — ED Triage Notes (Signed)
Patient ambulatory to triage with steady gait, without difficulty or distress noted, mask in place; pt reports body aches and congestion x 3wks; [redacted]wks pregnant; seen recently for URI; seen PCP yesterday and rx amoxi

## 2016-06-28 NOTE — ED Provider Notes (Signed)
[facility] Emergency Department Provider Note   ____________________________________________   First MD Initiated Contact with Patient 06/28/16 [id number]     (approximate)  I have reviewed the triage vital signs and the nursing notes.   HISTORY  Chief Complaint Nasal Congestion and Generalized Body Aches    HPI Penny Hardy is a 35 y.o. female who comes into the hospital today with body aches and illness. The patient reports that she has been sick for the past 3 weeks. She reports that she is also [redacted] weeks pregnant. The patient reports that she was here previously and diagnosed with an upper respiratory infection. She reports that it got a little better but then worsened. She went to her doctor on Monday and had his strep throat ordered but was placed on amoxicillin. She reports though that she is continued to feel bad. She has body aches all over with runny nose and congestion as well as a cough. The patient reports that she took Tylenol before she came in tonight. She is unsure she's had a fever because she is been taking the medicine. She did not go to her doctor's office and reports that her body aches are a 9 out of 10 in intensity. The patient also reports that she does have a headache. The patient reports that she has been taking the medication but she wanted to make sure she didn't have the flu or something worse with all the aches.    History reviewed. No pertinent past medical history.  There are no active problems to display for this patient.   History reviewed. No pertinent surgical history.  Prior to Admission medications   Medication Sig Start Date End Date Taking? Authorizing Provider  HYDROcodone-acetaminophen (NORCO/VICODIN) 5-325 MG per tablet Take 1 tablet by mouth every 4 (four) hours as needed for moderate pain. 04/01/15   Harvest Dark, MD  sulfamethoxazole-trimethoprim (BACTRIM DS,SEPTRA DS) 800-160 MG per tablet Take 1 tablet by  mouth 2 (two) times daily. 12/17/14   Sable Feil, PA-C    Allergies Asa [aspirin]  No family history on file.  Social History Social History  Substance Use Topics  . Smoking status: Former Smoker    Types: Cigarettes  . Smokeless tobacco: Never Used  . Alcohol use No    Review of Systems Constitutional: Sweats and chills Eyes: No visual changes. ENT: sore throat,Rhinorrhea and nasal congestion Cardiovascular: Denies chest pain. Respiratory:Cough Gastrointestinal: No abdominal pain.  No nausea, no vomiting.  No diarrhea.  No constipation. Genitourinary: Negative for dysuria. Musculoskeletal: Body aches Skin: Negative for rash. Neurological: Negative for headaches, focal weakness or numbness.  10-point ROS otherwise negative.  ____________________________________________   PHYSICAL EXAM:  VITAL SIGNS: ED Triage Vitals  Enc Vitals Group     BP 06/28/16 0237 112/71     Pulse Rate 06/28/16 0240 100     Resp 06/28/16 0240 (!) 24     Temp 06/28/16 0240 98.2 F (36.8 C)     Temp Source 06/28/16 0240 Oral     SpO2 06/28/16 0240 100 %     Weight 06/28/16 0234 234 lb (106.1 kg)     Height 06/28/16 0234 5' 1"  (1.549 m)     Head Circumference --      Peak Flow --      Pain Score 06/28/16 0234 10     Pain Loc --      Pain Edu? --      Excl. in [place]? --  Constitutional: Alert and oriented. Well appearing and in Moderate distress. Eyes: Conjunctivae are normal. PERRL. EOMI. Head: Atraumatic. Nose: No congestion/rhinnorhea. Mouth/Throat: Mucous membranes are moist.  Oropharynx non-erythematous. Cardiovascular: Normal rate, regular rhythm. Grossly normal heart sounds.  Good peripheral circulation. Respiratory: Normal respiratory effort.  No retractions. Lungs CTAB. Gastrointestinal: Soft and nontender. No distention. Positive bowel sounds Musculoskeletal: No lower extremity tenderness nor edema.   Neurologic:  Normal speech and language.  Skin:  Skin is warm, dry  and intact.  Psychiatric: Mood and affect are normal.   ____________________________________________   LABS (all labs ordered are listed, but only abnormal results are displayed)  Labs Reviewed  BASIC METABOLIC PANEL - Abnormal; Notable for the following:       Result Value   CO2 20 (*)    Glucose, Bld 102 (*)    Creatinine, Ser 0.41 (*)    Calcium 8.7 (*)    All other components within normal limits  URINALYSIS, COMPLETE (UACMP) WITH MICROSCOPIC - Abnormal; Notable for the following:    Color, Urine YELLOW (*)    APPearance CLEAR (*)    Leukocytes, UA TRACE (*)    Squamous Epithelial / LPF 0-5 (*)    All other components within normal limits  INFLUENZA PANEL BY PCR (TYPE A & B, H1N1)  CBC   ____________________________________________  EKG  none ____________________________________________  RADIOLOGY  CXR ____________________________________________   PROCEDURES  Procedure(s) performed: None  Procedures  Critical Care performed: No  ____________________________________________   INITIAL IMPRESSION / ASSESSMENT AND PLAN / ED COURSE  Pertinent labs & imaging results that were available during my care of the patient were reviewed by me and considered in my medical decision making (see chart for details).  This is a 35 year old female who comes into the hospital today with some body aches and pregnancy. She is currently taking antibiotics but reports that she wanted to make sure she didn't have the flu. The patient has been eating and drinking well without difficulty. I will give the patient a liter of normal saline. The patient's initial blood work is unremarkable and she doesn't have the flu. I will reassess the patient and determine her disposition.  Clinical Course as of Jun 28 612  Wed Jun 28, 2016  6045 No active cardiopulmonary disease. DG Chest 2 View [AW]    Clinical Course User Index [AW] Loney Hering, MD    The patient's flu is negative  and her chest x-ray does not show a pneumonia. I feel that the patient has an upper respiratory infection which is causing her symptoms. I did give the patient liter of normal saline and she is tired but otherwise has no new complaints. The patient be discharged to home to follow-up with her OB/GYN or her primary care physician. ____________________________________________   FINAL CLINICAL IMPRESSION(S) / ED DIAGNOSES  Final diagnoses:  Viral upper respiratory tract infection  Myalgia      NEW MEDICATIONS STARTED DURING THIS VISIT:  New Prescriptions   No medications on file     Note:  This document was prepared using Dragon voice recognition software and may include unintentional dictation errors.    Loney Hering, MD 06/28/16 [id number]

## 2016-06-28 NOTE — ED Notes (Signed)
See triage note...the patient c/o congestion, runny nose, gen body aches, h/a, sore throat and fever.  Pt alert and oriented, resp even and unlabored. +PO intake.  Pt sts some relief w/ tylenol.

## 2016-07-10 ENCOUNTER — Encounter: Payer: Self-pay | Admitting: Emergency Medicine

## 2016-07-10 DIAGNOSIS — Z3A16 16 weeks gestation of pregnancy: Secondary | ICD-10-CM | POA: Diagnosis not present

## 2016-07-10 DIAGNOSIS — Z87891 Personal history of nicotine dependence: Secondary | ICD-10-CM | POA: Insufficient documentation

## 2016-07-10 DIAGNOSIS — B349 Viral infection, unspecified: Secondary | ICD-10-CM | POA: Diagnosis not present

## 2016-07-10 DIAGNOSIS — O98512 Other viral diseases complicating pregnancy, second trimester: Secondary | ICD-10-CM | POA: Diagnosis not present

## 2016-07-10 DIAGNOSIS — O26892 Other specified pregnancy related conditions, second trimester: Secondary | ICD-10-CM | POA: Diagnosis present

## 2016-07-10 LAB — URINALYSIS, COMPLETE (UACMP) WITH MICROSCOPIC
Bilirubin Urine: NEGATIVE
GLUCOSE, UA: NEGATIVE mg/dL
Hgb urine dipstick: NEGATIVE
KETONES UR: 20 mg/dL — AB
Leukocytes, UA: NEGATIVE
Nitrite: NEGATIVE
Protein, ur: 30 mg/dL — AB
Specific Gravity, Urine: 1.026 (ref 1.005–1.030)
pH: 5 (ref 5.0–8.0)

## 2016-07-10 LAB — COMPREHENSIVE METABOLIC PANEL
ALBUMIN: 3.1 g/dL — AB (ref 3.5–5.0)
ALK PHOS: 75 U/L (ref 38–126)
ALT: 23 U/L (ref 14–54)
ANION GAP: 6 (ref 5–15)
AST: 20 U/L (ref 15–41)
BUN: 7 mg/dL (ref 6–20)
CALCIUM: 8.2 mg/dL — AB (ref 8.9–10.3)
CHLORIDE: 106 mmol/L (ref 101–111)
CO2: 23 mmol/L (ref 22–32)
Creatinine, Ser: 0.53 mg/dL (ref 0.44–1.00)
GFR calc non Af Amer: >60 mL/min (ref >60)
GLUCOSE: 100 mg/dL — AB (ref 65–99)
Potassium: 3.5 mmol/L (ref 3.5–5.1)
SODIUM: 135 mmol/L (ref 135–145)
Total Bilirubin: 0.2 mg/dL — ABNORMAL LOW (ref 0.3–1.2)
Total Protein: 6.8 g/dL (ref 6.5–8.1)

## 2016-07-10 LAB — CBC
HEMATOCRIT: 36.7 % (ref 35.0–47.0)
HEMOGLOBIN: 12.7 g/dL (ref 12.0–16.0)
MCH: 31 pg (ref 26.0–34.0)
MCHC: 34.7 g/dL (ref 32.0–36.0)
MCV: 89.5 fL (ref 80.0–100.0)
Platelets: 260 10*3/uL (ref 150–440)
RBC: 4.1 MIL/uL (ref 3.80–5.20)
RDW: 13.2 % (ref 11.5–14.5)
WBC: 11.8 10*3/uL — ABNORMAL HIGH (ref 3.6–11.0)

## 2016-07-10 LAB — HCG, QUANTITATIVE, PREGNANCY: HCG, BETA CHAIN, QUANT, S: 9462 m[IU]/mL — AB (ref <5)

## 2016-07-10 LAB — LIPASE, BLOOD: LIPASE: 16 U/L (ref 11–51)

## 2016-07-10 NOTE — ED Triage Notes (Signed)
Pt presents to triage room via wheelchair with c/o being sick for 5 weeks, pt reports was seen here and dx with upper respiratory infection, came back in 3 weeks and dx with another respiratory infection. Pt reports having n/v/d onset today. Pt denies fever at home. Pt also c/o generalized abd pain onset today. Pt is currently [redacted] weeks pregnant. Pt alert and oriented x 4, respirations even and unlabored, skin warm and dry.

## 2016-07-10 NOTE — ED Notes (Signed)
Unsuccessful attempt to obtain fetal heart tones by this RN.

## 2016-07-11 ENCOUNTER — Emergency Department
Admission: EM | Admit: 2016-07-11 | Discharge: 2016-07-11 | Disposition: A | Discharge status: Home / Self Care | Payer: Medicaid Other | Attending: Emergency Medicine | Admitting: Emergency Medicine

## 2016-07-11 ENCOUNTER — Emergency Department: Payer: Medicaid Other

## 2016-07-11 DIAGNOSIS — B349 Viral infection, unspecified: Secondary | ICD-10-CM

## 2016-07-11 LAB — POCT RAPID STREP A: Streptococcus, Group A Screen (Direct): NEGATIVE

## 2016-07-11 NOTE — Discharge Instructions (Signed)

## 2016-07-11 NOTE — ED Provider Notes (Signed)
[facility] Emergency Department Provider Note  ____________________________________________   First MD Initiated Contact with Patient 07/11/16 0138     (approximate)  I have reviewed the triage vital signs and the nursing notes.   HISTORY  Chief Complaint Abdominal Pain; Emesis; and Diarrhea    HPI Penny Hardy is a 36 y.o. female who is [redacted] weeks pregnant and presents for the third emergency department in about 5 weeks for evaluation of upper respiratory symptoms including subjective fever, cough, nasal congestion, sore throat, runny nose, general malaise, bodyaches, myalgias.  She states that she was feeling better after the last episode but now she is feeling worse again.  She said that today she had several episodes of vomiting and reports some vague abdominal discomfort as well.  The abdominal discomfort has resolved.  She has no vaginal bleeding and no discharge.  Overall she describes her symptoms as severe and nothing is making them better or worse.  She says that she is followed up as an outpatient but cannot remember if it was with her primary care doctor or with her OB/GYN.  She reports that she took a course of amoxicillin but is not sure who prescribed it.  She has a hoarse voice but says that that has been going on for a while now and that she has a sore throat but no difficulty swallowing and no difficulty breathing.   History reviewed. No pertinent past medical history.  There are no active problems to display for this patient.   Past Surgical History:  Procedure Laterality Date  . CESAREAN SECTION      Prior to Admission medications   Medication Sig Start Date End Date Taking? Authorizing Provider  HYDROcodone-acetaminophen (NORCO/VICODIN) 5-325 MG per tablet Take 1 tablet by mouth every 4 (four) hours as needed for moderate pain. 04/01/15   Harvest Dark, MD  sulfamethoxazole-trimethoprim (BACTRIM DS,SEPTRA DS) 800-160 MG per tablet  Take 1 tablet by mouth 2 (two) times daily. 12/17/14   Sable Feil, PA-C    Allergies Asa [aspirin]  No family history on file.  Social History Social History  Substance Use Topics  . Smoking status: Former Smoker    Types: Cigarettes  . Smokeless tobacco: Never Used  . Alcohol use No    Review of Systems Constitutional: Subjective fever/chills, myalgias, fatigue Eyes: No visual changes. ENT: Sore throat, runny nose, nasal congestion. Cardiovascular: Denies chest pain. Respiratory: Denies shortness of breath.  Cough Gastrointestinal: No abdominal pain.  No nausea, no vomiting.  No diarrhea.  No constipation. Genitourinary: Negative for dysuria.  No vaginal bleeding. Musculoskeletal: Negative for back pain. Skin: Negative for rash. Neurological: Intermittent headache  10-point ROS otherwise negative.  ____________________________________________   PHYSICAL EXAM:  VITAL SIGNS: ED Triage Vitals  Enc Vitals Group     BP 07/10/16 2231 121/73     Pulse Rate 07/10/16 2231 (!) 108     Resp 07/10/16 2231 20     Temp 07/10/16 2231 98 F (36.7 C)     Temp Source 07/10/16 2231 Oral     SpO2 07/10/16 2231 97 %     Weight 07/10/16 2231 234 lb (106.1 kg)     Height 07/10/16 2231 5' 1"  (1.549 m)     Head Circumference --      Peak Flow --      Pain Score 07/10/16 2234 8     Pain Loc --      Pain Edu? --  Excl. in [place]? --     Constitutional: Alert and oriented. Well appearing and in no acute distress. Eyes: Conjunctivae are normal. PERRL. EOMI. Head: Atraumatic. Nose: No congestion/rhinnorhea. Mouth/Throat: Mucous membranes are moist.  Posterior oropharynx is erythematous with no sign of abscess and no exudate or petechiae Neck: No stridor.  No meningeal signs.   Cardiovascular: Normal rate, regular rhythm. Good peripheral circulation. Grossly normal heart sounds. Respiratory: Normal respiratory effort.  No retractions. Lungs CTAB. Gastrointestinal: Soft and  nontender. No distention.  Musculoskeletal: No lower extremity tenderness nor edema. No gross deformities of extremities. Neurologic:  Normal speech and language. No gross focal neurologic deficits are appreciated.  Skin:  Skin is warm, dry and intact. No rash noted. Psychiatric: Mood and affect are normal. Speech and behavior are normal.  ____________________________________________   LABS (all labs ordered are listed, but only abnormal results are displayed)  Labs Reviewed  COMPREHENSIVE METABOLIC PANEL - Abnormal; Notable for the following:       Result Value   Glucose, Bld 100 (*)    Calcium 8.2 (*)    Albumin 3.1 (*)    Total Bilirubin 0.2 (*)    All other components within normal limits  CBC - Abnormal; Notable for the following:    WBC 11.8 (*)    All other components within normal limits  URINALYSIS, COMPLETE (UACMP) WITH MICROSCOPIC - Abnormal; Notable for the following:    Color, Urine YELLOW (*)    APPearance CLEAR (*)    Ketones, ur 20 (*)    Protein, ur 30 (*)    Bacteria, UA RARE (*)    Squamous Epithelial / LPF 0-5 (*)    All other components within normal limits  HCG, QUANTITATIVE, PREGNANCY - Abnormal; Notable for the following:    hCG, Beta Chain, Quant, S 9,462 (*)    All other components within normal limits  LIPASE, BLOOD  POCT RAPID STREP A   ____________________________________________  EKG  None - EKG not ordered by ED physician ____________________________________________  RADIOLOGY   Dg Chest 2 View  Result Date: 07/11/2016 CLINICAL DATA:  Persistent cough fever and viral symptoms. EXAM: CHEST  2 VIEW COMPARISON:  06/28/2016 FINDINGS: The heart size and mediastinal contours are within normal limits. Both lungs are clear. The visualized skeletal structures are unremarkable. IMPRESSION: No active cardiopulmonary disease. Electronically Signed   By: Andreas Newport M.D.   On: 07/11/2016 02:23     ____________________________________________   PROCEDURES  Procedure(s) performed:   Procedures   Critical Care performed: No ____________________________________________   INITIAL IMPRESSION / ASSESSMENT AND PLAN / ED COURSE  Pertinent labs & imaging results that were available during my care of the patient were reviewed by me and considered in my medical decision making (see chart for details).  The patient's has worsening symptoms after a viral illness that was improving.  It is possible she could have developed a pneumonia superinfection.  She had an essentially normal battery of tests the last 2 times that she has been to the emergency department, and today her labs are again reassuring.  Her metabolic panel is essentially normal and her CBC demonstrates a very slight leukocytosis of 11.8 which could simply be the result of pregnancy but also could be the result of her viral infection (it is up slightly from 8.9 about 2 weeks ago).  In triage she had a very mild tachycardia but that has resolved while she is waiting in the exam room and she was sleeping comfortably  when I checked on her.  She does have obvious viral symptoms including an erythematous throat but there is no sign of abscess or deep tissue infection and I again think her symptoms are likely viral.  However I will again check a chest x-ray to make sure she does not have signs of pneumonia (had the discussion of x-ray exposure during pregnancy but we both agreed it would be appropriate under the circumstances).  He has absolutely no abdominal tenderness to palpation at this time and she has Zofran at home for nausea control.  I explained to the patient and husband that the symptoms are likely viral in the time to resolve and that she needs to follow with her primary care doctor.  Clinical Course as of Jul 11 304  Tue Jul 11, 2016  0304 The patient's chest x-ray was reassuring.  She continues to feel ill but is in no  acute distress.  I used the bedside ultrasound to visualize her fetus and we are able to see the fetus moving and to identify appropriate cardiac activity.  I reiterated by viral syndrome recommendations and encourage close outpatient follow-up with her PCP.  She understands and agrees with plan.  [CF]    Clinical Course User Index [CF] Hinda Kehr, MD    ____________________________________________  FINAL CLINICAL IMPRESSION(S) / ED DIAGNOSES  Final diagnoses:  Viral syndrome     MEDICATIONS GIVEN DURING THIS VISIT:  Medications - No data to display   NEW OUTPATIENT MEDICATIONS STARTED DURING THIS VISIT:  New Prescriptions   No medications on file    Modified Medications   No medications on file    Discontinued Medications   No medications on file     Note:  This document was prepared using Dragon voice recognition software and may include unintentional dictation errors.    Hinda Kehr, MD 07/11/16 [phone]

## 2016-09-19 ENCOUNTER — Observation Stay
Admission: EM | Admit: 2016-09-19 | Discharge: 2016-09-19 | Disposition: A | Discharge status: Home / Self Care | Payer: Medicaid Other | Attending: Obstetrics and Gynecology | Admitting: Obstetrics and Gynecology

## 2016-09-19 ENCOUNTER — Encounter: Payer: Self-pay | Admitting: *Deleted

## 2016-09-19 DIAGNOSIS — M545 Low back pain: Secondary | ICD-10-CM | POA: Insufficient documentation

## 2016-09-19 DIAGNOSIS — W000XXA Fall on same level due to ice and snow, initial encounter: Secondary | ICD-10-CM | POA: Diagnosis not present

## 2016-09-19 DIAGNOSIS — W19XXXA Unspecified fall, initial encounter: Secondary | ICD-10-CM | POA: Diagnosis present

## 2016-09-19 DIAGNOSIS — Z87891 Personal history of nicotine dependence: Secondary | ICD-10-CM | POA: Insufficient documentation

## 2016-09-19 DIAGNOSIS — O26892 Other specified pregnancy related conditions, second trimester: Principal | ICD-10-CM | POA: Insufficient documentation

## 2016-09-19 DIAGNOSIS — Z3A26 26 weeks gestation of pregnancy: Secondary | ICD-10-CM | POA: Insufficient documentation

## 2016-09-19 DIAGNOSIS — Z886 Allergy status to analgesic agent status: Secondary | ICD-10-CM | POA: Diagnosis not present

## 2016-09-19 MED ORDER — CYCLOBENZAPRINE HCL 10 MG PO TABS
10.0000 mg | ORAL_TABLET | Freq: Three times a day (TID) | ORAL | Status: DC | PRN
  Administered 2016-09-19: 10 mg via ORAL

## 2016-09-19 MED ORDER — ACETAMINOPHEN 500 MG PO TABS
1000.0000 mg | ORAL_TABLET | Freq: Four times a day (QID) | ORAL | Status: DC | PRN
  Administered 2016-09-19: 1000 mg via ORAL
  Filled 2016-09-19: qty 2

## 2016-09-19 MED ORDER — CYCLOBENZAPRINE HCL 10 MG PO TABS
5.0000 mg | ORAL_TABLET | Freq: Three times a day (TID) | ORAL | Status: DC | PRN
  Filled 2016-09-19: qty 1

## 2016-09-19 NOTE — Discharge Instructions (Signed)
Get plenty of rest and drink fluids. Call you provider with any other questions.

## 2016-09-19 NOTE — Discharge Summary (Signed)
Penny Hardy is a 36 y.o. female. She is at 52w1dgestation. Patient's last menstrual period was 03/27/2016. Estimated Date of Delivery: 12/25/16  [facility] Chief Complaint:  "i fell"  S:  Resting comfortably. no CTX, no VB.no LOF,  Active fetal movement.   Location: groin Context: patient slipped on the ice and fell into a split Onset/timing/duration: this morning, once Quality: ache Severity: moderate Aggravating factors: movement Alleviating factors: rest Associated signs/symptoms: able to walk, no loss of urine, no loss of consciousness, no bleeding or lacerations to skin.  Low back also hurts   Maternal Medical History:  History reviewed. No pertinent past medical history.  Past Surgical History:  Procedure Laterality Date  . CESAREAN SECTION      Allergies  Allergen Reactions  . Asa [Aspirin] Hives    Prior to Admission medications   Medication Sig Start Date End Date Taking? Authorizing Provider  Prenatal Vit-Fe Fumarate-FA (PRENATAL MULTIVITAMIN) TABS tablet Take 1 tablet by mouth daily at 12 noon.   Yes Historical Provider, MD  HYDROcodone-acetaminophen (NORCO/VICODIN) 5-325 MG per tablet Take 1 tablet by mouth every 4 (four) hours as needed for moderate pain. Patient not taking: Reported on 09/19/2016 04/01/15   KHarvest Dark MD  sulfamethoxazole-trimethoprim (BACTRIM DS,SEPTRA DS) 800-160 MG per tablet Take 1 tablet by mouth 2 (two) times daily. Patient not taking: Reported on 09/19/2016 12/17/14   RSable Feil PA-C      Social History: She  reports that she has quit smoking. Her smoking use included Cigarettes. She has never used smokeless tobacco. She reports that she does not drink alcohol or use drugs.  Family History:  no history of gyn cancers  Review of Systems: A full review of systems was performed and negative except as noted in the HPI.     O:  BP 110/67   Pulse 96   Temp 98.4 F (36.9 C) (Oral)   Resp 18   LMP  03/27/2016   Constitutional: NAD, AAOx3  HE/ENT: extraocular movements grossly intact, moist mucous membranes CV: RRR PULM: nl respiratory effort, CTABL     Abd: gravid, non-tender, non-distended, soft      Ext: Non-tender, Nonedmeatous   Psych: mood appropriate, speech normal Pelvic: pain in bilateral groin, low back, and pubic symphysis, mildly tender to palpation.  Vaginal exam deferred.  FHT: 145 mod TOCO: quiet  No results found.  A/P:  35yo [id number]@ 26.1 with fall  Labor: not present.   Fetal Wellbeing: Reassuring Cat 1 tracing.  Apply ice frequently, take tylenol and flexeril PRN.  Patient allergic to NSAIDS.  D/c home stable, precautions reviewed, follow-up as scheduled.   ----- CLarey Days MD Attending Obstetrician and Gynecologist [facility]

## 2016-09-19 NOTE — OB Triage Note (Signed)
Pt fell on porch this morning. She states that she kinda did a split but fell on bottom. Did not hit abdomen. States pain is in crotch area and lower back pain.  States some pressure in her vagina. Pt has had 2 vaginal deliveries and 1 c/s for breech presentation.  Denies bleeding or leakage of fluid.

## 2016-10-20 DIAGNOSIS — O1203 Gestational edema, third trimester: Secondary | ICD-10-CM | POA: Insufficient documentation

## 2016-12-29 ENCOUNTER — Emergency Department
Admission: EM | Admit: 2016-12-29 | Discharge: 2016-12-29 | Disposition: A | Discharge status: Home / Self Care | Payer: Medicaid Other | Attending: Emergency Medicine | Admitting: Emergency Medicine

## 2016-12-29 ENCOUNTER — Emergency Department: Payer: Medicaid Other

## 2016-12-29 DIAGNOSIS — N644 Mastodynia: Secondary | ICD-10-CM | POA: Insufficient documentation

## 2016-12-29 DIAGNOSIS — R0781 Pleurodynia: Secondary | ICD-10-CM

## 2016-12-29 DIAGNOSIS — Z87891 Personal history of nicotine dependence: Secondary | ICD-10-CM | POA: Diagnosis not present

## 2016-12-29 LAB — BASIC METABOLIC PANEL
ANION GAP: 8 (ref 5–15)
BUN: 12 mg/dL (ref 6–20)
CHLORIDE: 106 mmol/L (ref 101–111)
CO2: 24 mmol/L (ref 22–32)
CREATININE: 0.67 mg/dL (ref 0.44–1.00)
Calcium: 9.4 mg/dL (ref 8.9–10.3)
GFR calc non Af Amer: >60 mL/min (ref >60)
Glucose, Bld: 103 mg/dL — ABNORMAL HIGH (ref 65–99)
Potassium: 3.8 mmol/L (ref 3.5–5.1)
Sodium: 138 mmol/L (ref 135–145)

## 2016-12-29 LAB — CBC
HEMATOCRIT: 34.4 % — AB (ref 35.0–47.0)
HEMOGLOBIN: 11.6 g/dL — AB (ref 12.0–16.0)
MCH: 31.2 pg (ref 26.0–34.0)
MCHC: 33.9 g/dL (ref 32.0–36.0)
MCV: 92 fL (ref 80.0–100.0)
Platelets: 428 10*3/uL (ref 150–440)
RBC: 3.73 MIL/uL — AB (ref 3.80–5.20)
RDW: 13.3 % (ref 11.5–14.5)
WBC: 4.8 10*3/uL (ref 3.6–11.0)

## 2016-12-29 LAB — TROPONIN I: Troponin I: <0.03 ng/mL (ref <0.03)

## 2016-12-29 MED ORDER — IOPAMIDOL (ISOVUE-370) INJECTION 76%
100.0000 mL | Freq: Once | INTRAVENOUS | Status: AC | PRN
  Administered 2016-12-29: 100 mL via INTRAVENOUS

## 2016-12-29 MED ORDER — DICLOXACILLIN SODIUM 250 MG PO CAPS: 500.0000 mg | ORAL_CAPSULE | Freq: Four times a day (QID) | ORAL | 0 refills | Status: AC

## 2016-12-29 MED ORDER — OXYCODONE-ACETAMINOPHEN 5-325 MG PO TABS
1.0000 | ORAL_TABLET | ORAL | Status: AC
  Administered 2016-12-29: 1 via ORAL
  Filled 2016-12-29: qty 1

## 2016-12-29 MED ORDER — OXYCODONE-ACETAMINOPHEN 5-325 MG PO TABS: 1.0000 | ORAL_TABLET | Freq: Four times a day (QID) | ORAL | 0 refills | Status: DC | PRN

## 2016-12-29 MED ORDER — DICLOXACILLIN SODIUM 500 MG PO CAPS
500.0000 mg | ORAL_CAPSULE | ORAL | Status: AC
  Administered 2016-12-29: 500 mg via ORAL
  Filled 2016-12-29: qty 1

## 2016-12-29 MED ORDER — DICLOXACILLIN SODIUM 500 MG PO CAPS
500.0000 mg | ORAL_CAPSULE | Freq: Four times a day (QID) | ORAL | Status: DC
Start: 2016-12-29 — End: 2016-12-29

## 2016-12-29 NOTE — ED Notes (Signed)
Breast pump delivered to patient in room 25.

## 2016-12-29 NOTE — ED Notes (Signed)
Patient transported to X-ray 

## 2016-12-29 NOTE — ED Provider Notes (Signed)
[facility] Emergency Department Provider Note   ____________________________________________   First MD Initiated Contact with Patient 12/29/16 1329     (approximate)  I have reviewed the triage vital signs and the nursing notes.   HISTORY  Chief Complaint Chest Pain   HPI Penny Hardy is a 36 y.o. female recent cesarean section and delivery of healthy and fit  The patient began experiencing pain around the right upper breast, worsened with movement and somewhat worse with inspiration. It started about 10 AM today. Also associated feeling of some chills and fatigue. Feels like she's been a little achy as though she started to develop a feeling of illness   She reports she was hospitalized for about 3-4 days. She had a C-section. Check an ultrasound of her left leg reporting that she had some swelling noted before being hospitalized. Denies any history of blood clots. No heavy chest pressure. Describes a sharp aching sensation primarily over the upper part of the right breast. She is breast-feeding, has not noticed any discharge.  No abdominal pain except she has a small knot over her C-section incision without any redness or drainage which she has previously notified her gynecology team about.   History reviewed. No pertinent past medical history.  Patient Active Problem List   Diagnosis Date Noted  . Fall 09/19/2016    Past Surgical History:  Procedure Laterality Date  . CESAREAN SECTION      Prior to Admission medications   Medication Sig Start Date End Date Taking? Authorizing Provider  acetaminophen (TYLENOL) 325 MG tablet Take 650 mg by mouth every 6 (six) hours as needed. 12/22/16  Yes [provider]  docusate sodium (COLACE) 100 MG capsule Take 100 mg by mouth 2 (two) times daily as needed for constipation. 12/22/16 01/21/17 Yes [provider]  ferrous sulfate (FERROUSUL) 325 (65 FE) MG tablet Take 325 mg by mouth 2  (two) times daily with a meal. 12/22/16 12/22/17 Yes [provider]  gabapentin (NEURONTIN) 300 MG capsule Take 300 mg by mouth 3 (three) times daily. 12/22/16 01/21/17 Yes [provider]  Oxycodone HCl 10 MG TABS Take 10 mg by mouth every 4 (four) hours as needed. 12/22/16 12/29/16 Yes [provider]  Prenatal Vit-Fe Fumarate-FA (PRENATAL MULTIVITAMIN) TABS tablet Take 1 tablet by mouth daily at 12 noon.   Yes [provider]  dicloxacillin (DYNAPEN) 250 MG capsule Take 2 capsules (500 mg total) by mouth 4 (four) times daily. 12/29/16 01/08/17  Delman Kitten, MD  HYDROcodone-acetaminophen (NORCO/VICODIN) 5-325 MG per tablet Take 1 tablet by mouth every 4 (four) hours as needed for moderate pain. Patient not taking: Reported on 09/19/2016 04/01/15   Harvest Dark, MD  oxyCODONE-acetaminophen (ROXICET) 5-325 MG tablet Take 1 tablet by mouth every 6 (six) hours as needed for severe pain. 12/29/16   Delman Kitten, MD  sulfamethoxazole-trimethoprim (BACTRIM DS,SEPTRA DS) 800-160 MG per tablet Take 1 tablet by mouth 2 (two) times daily. Patient not taking: Reported on 09/19/2016 12/17/14   Sable Feil, PA-C    Allergies Asa [aspirin]  No family history on file.  Social History Social History  Substance Use Topics  . Smoking status: Former Smoker    Types: Cigarettes  . Smokeless tobacco: Never Used  . Alcohol use No    Review of Systems Constitutional: Some chills and shakes Eyes: No visual changes. ENT: No sore throat. Cardiovascular: See history of present illness Respiratory: Denies shortness of breath. Gastrointestinal: No abdominal  pain.  No nausea, no vomiting.  No diarrhea.  No constipation. See history of present illness Genitourinary: Negative for dysuria. Musculoskeletal: Negative for back pain. Skin: Negative for rash. Neurological: Negative for headaches, focal weakness or  numbness.    ____________________________________________   PHYSICAL EXAM:  VITAL SIGNS: ED Triage Vitals  Enc Vitals Group     BP 12/29/16 1133 116/71     Pulse Rate 12/29/16 1133 82     Resp 12/29/16 1133 17     Temp 12/29/16 1133 98 F (36.7 C)     Temp Source 12/29/16 1133 Oral     SpO2 12/29/16 1133 98 %     Weight 12/29/16 1131 263 lb (119.3 kg)     Height 12/29/16 1131 5' 2"  (1.575 m)     Head Circumference --      Peak Flow --      Pain Score 12/29/16 1130 7     Pain Loc --      Pain Edu? --      Excl. in [place]? --     Constitutional: Alert and oriented. Well appearing and in no acute distress. Eyes: Conjunctivae are normal. Head: Atraumatic. Nose: No congestion/rhinnorhea. Mouth/Throat: Mucous membranes are moist. Neck: No stridor.   Cardiovascular: Normal rate, regular rhythm. Grossly normal heart sounds.  Good peripheral circulation.Exam of the right breast with the breast-feeding educator present, the patient reports mild tenderness across the superior portion of the right breast without any obvious erythema. Respiratory: Normal respiratory effort.  No retractions. Lungs CTAB. Gastrointestinal: Soft and nontender. No distention. A small less than 1 mm cystic lesion palpable below the midline horizontal cesarean incision. Incision itself is well-healed, Steri-Strips present. Clean dry and intact. No erythema or drainage. No focal abdominal tenderness or peritonitis. Musculoskeletal: No lower extremity tenderness nor edema. Neurologic:  Normal speech and language. No gross focal neurologic deficits are appreciated.  Skin:  Skin is warm, dry and intact. No rash noted. Psychiatric: Mood and affect are normal. Speech and behavior are normal.  ____________________________________________   LABS (all labs ordered are listed, but only abnormal results are displayed)  Labs Reviewed  BASIC METABOLIC PANEL - Abnormal; Notable for the following:       Result Value    Glucose, Bld 103 (*)    All other components within normal limits  CBC - Abnormal; Notable for the following:    RBC 3.73 (*)    Hemoglobin 11.6 (*)    HCT 34.4 (*)    All other components within normal limits  TROPONIN I   ____________________________________________  EKG  ED ECG REPORT I, Abbeygail Igoe, the attending physician, personally viewed and interpreted this ECG.  Date: 12/29/2016 EKG Time: 11:30 Rate: 90 Rhythm: normal sinus rhythm QRS Axis: normal Intervals: normal ST/T Wave abnormalities: normal Narrative Interpretation: unremarkable  ____________________________________________  RADIOLOGY  Dg Chest 2 View  Result Date: 12/29/2016 CLINICAL DATA:  Shortness of breath. Right-sided chest pain since this morning. Recent C-section. EXAM: CHEST  2 VIEW COMPARISON:  07/11/2016 and 06/28/2016 radiographs. FINDINGS: The heart size and mediastinal contours are normal. The lungs are clear. There is no pleural effusion or pneumothorax. No acute osseous findings are identified. IMPRESSION: Stable chest.  No active cardiopulmonary process. Electronically Signed   By: Richardean Sale M.D.   On: 12/29/2016 12:01   Ct Angio Chest Pe W Or Wo Contrast  Result Date: 12/29/2016 CLINICAL DATA:  Right-sided chest pain with shortness of breath today after C-section 10 days ago.  EXAM: CT ANGIOGRAPHY CHEST WITH CONTRAST TECHNIQUE: Multidetector CT imaging of the chest was performed using the standard protocol during bolus administration of intravenous contrast. Multiplanar CT image reconstructions and MIPs were obtained to evaluate the vascular anatomy. CONTRAST:  100 cc Isovue 370 COMPARISON:  Abdomen and pelvis CT 01/09/2013. FINDINGS: Cardiovascular: The heart size is normal. No pericardial effusion. No thoracic aortic aneurysm. No visible dissection flap in the thoracic aorta. No evidence for filling defect in the opacified pulmonary arteries to suggest the presence of an acute pulmonary  embolus. Mediastinum/Nodes: No mediastinal lymphadenopathy. There is no hilar lymphadenopathy. The esophagus has normal imaging features. There is no axillary lymphadenopathy. Lungs/Pleura: No focal airspace consolidation. No overt airspace pulmonary edema or pleural effusion. Patchy ground-glass attenuation in the dependent lungs bilaterally and may be atelectasis although small airways disease could also produce this appearance. Upper Abdomen: Tiny 8 mm hypervascular focus posterior right liver is almost assuredly benign in this individual although it cannot be definitively characterize. The liver shows diffusely decreased attenuation suggesting steatosis. Large gallstone evident, as seen previously. Musculoskeletal: Bone windows reveal no worrisome lytic or sclerotic osseous lesions. Review of the MIP images confirms the above findings. IMPRESSION: 1. No CT evidence for acute pulmonary embolus. 2. Patchy ground-glass attenuation in the dependent lung bases may be atelectatic although small airways disease could produce this appearance. 3. Cholelithiasis. Electronically Signed   By: Misty Stanley M.D.   On: 12/29/2016 15:05   US Venous Img Lower Bilateral  Result Date: 12/29/2016 CLINICAL DATA:  Chest pain shortness of breath this morning. Status post Cesarean section 12/19/2016. EXAM: BILATERAL LOWER EXTREMITY VENOUS DOPPLER ULTRASOUND TECHNIQUE: Gray-scale sonography with graded compression, as well as color Doppler and duplex ultrasound were performed to evaluate the lower extremity deep venous systems from the level of the common femoral vein and including the common femoral, femoral, profunda femoral, popliteal and calf veins including the posterior tibial, peroneal and gastrocnemius veins when visible. The superficial great saphenous vein was also interrogated. Spectral Doppler was utilized to evaluate flow at rest and with distal augmentation maneuvers in the common femoral, femoral and popliteal veins.  COMPARISON:  None. FINDINGS: RIGHT LOWER EXTREMITY Common Femoral Vein: No evidence of thrombus. Normal compressibility, respiratory phasicity and response to augmentation. Saphenofemoral Junction: No evidence of thrombus. Normal compressibility and flow on color Doppler imaging. Profunda Femoral Vein: No evidence of thrombus. Normal compressibility and flow on color Doppler imaging. Femoral Vein: No evidence of thrombus. Normal compressibility, respiratory phasicity and response to augmentation. Popliteal Vein: No evidence of thrombus. Normal compressibility, respiratory phasicity and response to augmentation. Calf Veins: No evidence of thrombus. Normal compressibility and flow on color Doppler imaging. Superficial Great Saphenous Vein: No evidence of thrombus. Normal compressibility and flow on color Doppler imaging. Venous Reflux:  None. Other Findings:  None. LEFT LOWER EXTREMITY Common Femoral Vein: No evidence of thrombus. Normal compressibility, respiratory phasicity and response to augmentation. Saphenofemoral Junction: No evidence of thrombus. Normal compressibility and flow on color Doppler imaging. Profunda Femoral Vein: No evidence of thrombus. Normal compressibility and flow on color Doppler imaging. Femoral Vein: No evidence of thrombus. Normal compressibility, respiratory phasicity and response to augmentation. Popliteal Vein: No evidence of thrombus. Normal compressibility, respiratory phasicity and response to augmentation. Calf Veins: No evidence of thrombus. Normal compressibility and flow on color Doppler imaging. Superficial Great Saphenous Vein: No evidence of thrombus. Normal compressibility and flow on color Doppler imaging. Venous Reflux:  None. Other Findings:  None. IMPRESSION: No evidence  of DVT within either lower extremity. Electronically Signed   By: Inge Rise M.D.   On: 12/29/2016 15:42    ____________________________________________   PROCEDURES  Procedure(s)  performed: None  Procedures  Critical Care performed: No  ____________________________________________   INITIAL IMPRESSION / ASSESSMENT AND PLAN / ED COURSE  Pertinent labs & imaging results that were available during my care of the patient were reviewed by me and considered in my medical decision making (see chart for details).  Evaluation for right rest pain. Somewhat pleuritic nature and reportedly felt short of breath earlier. No hypoxia, but does have risk factors for pulmonary animals him. CT scan does not demonstrate any evidence of pulmonary and wasn't. In addition, EKG reassuring with no significant risk fracture coronary disease.  She does have tenderness of the superior breast and given the associated slight myalgias and chills with recent breast-feeding mastitis is also strong consideration, he is not yet developed redness in the area or obvious discharge.  ----------------------------------------- 4:49 PM on 12/29/2016 -----------------------------------------  I will prescribe the patient a narcotic pain medicine due to their condition which I anticipate will cause at least moderate pain short term. I discussed with the patient safe use of narcotic pain medicines, and that they are not to drive, work in dangerous areas, or ever take more than prescribed (no more than 1 pill every 6 hours). We discussed that this is the type of medication that can be  overdosed on and the risks of this type of medicine. Patient is very agreeable to only use as prescribed and to never use more than prescribed.  Patient reports improvement. Resting comfortably. Reviewed results, and thus far no obvious emergent etiology noted but I suspect she may be developing mastitis. The patient agreeable with careful return precautions and will follow up via phone call to her obstetrics at [facility] to setup a follow-up for symptoms and reexam of her lower abdomen.       ____________________________________________   FINAL CLINICAL IMPRESSION(S) / ED DIAGNOSES  Final diagnoses:  Pleuritic chest pain  Breast pain, right      NEW MEDICATIONS STARTED DURING THIS VISIT:  New Prescriptions   DICLOXACILLIN (DYNAPEN) 250 MG CAPSULE    Take 2 capsules (500 mg total) by mouth 4 (four) times daily.   OXYCODONE-ACETAMINOPHEN (ROXICET) 5-325 MG TABLET    Take 1 tablet by mouth every 6 (six) hours as needed for severe pain.     Note:  This document was prepared using Dragon voice recognition software and may include unintentional dictation errors.     Delman Kitten, MD 12/29/16 1650

## 2016-12-29 NOTE — ED Notes (Signed)
Patient call bell on ,taken patient a pad.

## 2016-12-29 NOTE — ED Triage Notes (Signed)
Pt had c-section 6/12 and is having right sided chest pain with SOB today, worse with deep breathing and movement.

## 2017-05-14 ENCOUNTER — Encounter: Payer: Self-pay | Admitting: *Deleted

## 2017-05-14 ENCOUNTER — Emergency Department
Admission: EM | Admit: 2017-05-14 | Discharge: 2017-05-14 | Disposition: A | Discharge status: Home / Self Care | Payer: Self-pay | Attending: Emergency Medicine | Admitting: Emergency Medicine

## 2017-05-14 ENCOUNTER — Other Ambulatory Visit: Payer: Self-pay

## 2017-05-14 DIAGNOSIS — R109 Unspecified abdominal pain: Secondary | ICD-10-CM | POA: Insufficient documentation

## 2017-05-14 DIAGNOSIS — Z5321 Procedure and treatment not carried out due to patient leaving prior to being seen by health care provider: Secondary | ICD-10-CM | POA: Insufficient documentation

## 2017-05-14 DIAGNOSIS — R112 Nausea with vomiting, unspecified: Secondary | ICD-10-CM | POA: Insufficient documentation

## 2017-05-14 DIAGNOSIS — R197 Diarrhea, unspecified: Secondary | ICD-10-CM | POA: Insufficient documentation

## 2017-05-14 NOTE — ED Triage Notes (Signed)
Pt  Complains of abdominal pain, nausea vomiting and diarrhea starting at 1030 this morning

## 2018-08-29 ENCOUNTER — Emergency Department
Admission: EM | Admit: 2018-08-29 | Discharge: 2018-08-30 | Disposition: A | Discharge status: Home / Self Care | Payer: Medicaid Other | Attending: Emergency Medicine | Admitting: Emergency Medicine

## 2018-08-29 ENCOUNTER — Other Ambulatory Visit: Payer: Self-pay

## 2018-08-29 ENCOUNTER — Encounter: Payer: Self-pay | Admitting: Emergency Medicine

## 2018-08-29 DIAGNOSIS — F17210 Nicotine dependence, cigarettes, uncomplicated: Secondary | ICD-10-CM | POA: Insufficient documentation

## 2018-08-29 DIAGNOSIS — K120 Recurrent oral aphthae: Secondary | ICD-10-CM | POA: Insufficient documentation

## 2018-08-29 LAB — COMPREHENSIVE METABOLIC PANEL
ALBUMIN: 3.6 g/dL (ref 3.5–5.0)
ALT: 26 U/L (ref 0–44)
AST: 17 U/L (ref 15–41)
Alkaline Phosphatase: 78 U/L (ref 38–126)
Anion gap: 8 (ref 5–15)
BILIRUBIN TOTAL: 0.6 mg/dL (ref 0.3–1.2)
BUN: 9 mg/dL (ref 6–20)
CO2: 27 mmol/L (ref 22–32)
Calcium: 8.6 mg/dL — ABNORMAL LOW (ref 8.9–10.3)
Chloride: 104 mmol/L (ref 98–111)
Creatinine, Ser: 0.68 mg/dL (ref 0.44–1.00)
GFR calc non Af Amer: >60 mL/min (ref >60)
Glucose, Bld: 88 mg/dL (ref 70–99)
POTASSIUM: 3.7 mmol/L (ref 3.5–5.1)
Sodium: 139 mmol/L (ref 135–145)
TOTAL PROTEIN: 7.2 g/dL (ref 6.5–8.1)

## 2018-08-29 LAB — CBC
HCT: 37.8 % (ref 36.0–46.0)
Hemoglobin: 12.1 g/dL (ref 12.0–15.0)
MCH: 30 pg (ref 26.0–34.0)
MCHC: 32 g/dL (ref 30.0–36.0)
MCV: 93.6 fL (ref 80.0–100.0)
Platelets: 427 10*3/uL — ABNORMAL HIGH (ref 150–400)
RBC: 4.04 MIL/uL (ref 3.87–5.11)
RDW: 12.1 % (ref 11.5–15.5)
WBC: 8.8 10*3/uL (ref 4.0–10.5)
nRBC: 0 % (ref 0.0–0.2)

## 2018-08-29 LAB — URINALYSIS, COMPLETE (UACMP) WITH MICROSCOPIC
Bilirubin Urine: NEGATIVE
Glucose, UA: NEGATIVE mg/dL
Ketones, ur: 5 mg/dL — AB
NITRITE: NEGATIVE
PH: 6 (ref 5.0–8.0)
Protein, ur: NEGATIVE mg/dL
SPECIFIC GRAVITY, URINE: 1.015 (ref 1.005–1.030)

## 2018-08-29 LAB — POCT PREGNANCY, URINE: Preg Test, Ur: NEGATIVE

## 2018-08-29 LAB — LIPASE, BLOOD: Lipase: 24 U/L (ref 11–51)

## 2018-08-29 LAB — GROUP A STREP BY PCR: Group A Strep by PCR: NOT DETECTED

## 2018-08-29 NOTE — ED Triage Notes (Addendum)
Pt presents to ED with sore throat, sores in her mouth (gums and tongue), and in the back of her throat (since yesterday) that has been worsening for the past 3 weeks. Pt states my "intestines are cramping up". +Diarrhea >3X day. Seen by her pcp and had labs drawn and STD tests performed. Told results were negative but she "may have an underlying disease". "knots under arms and near vagina that are painful" to touch.

## 2018-08-29 NOTE — ED Notes (Signed)
MD at bedside. 

## 2018-08-30 MED ORDER — TRIAMCINOLONE ACETONIDE 0.5 % EX OINT: 1.0000 "application " | TOPICAL_OINTMENT | Freq: Two times a day (BID) | CUTANEOUS | 0 refills | Status: DC

## 2018-08-30 NOTE — ED Notes (Signed)
Pt verbalized understanding of d/c instructions, rx, and f/u care. No further questions a this time. Pt ambulatory to the exit with steady gait.

## 2018-08-30 NOTE — ED Provider Notes (Signed)
[facility] Emergency Department Provider Note  ____________________________________________  Time seen: Approximately 12:18 AM  I have reviewed the triage vital signs and the nursing notes.   HISTORY  Chief Complaint Sore Throat; Abdominal Pain; Mouth Lesions; and Diarrhea    HPI Penny Hardy is a 38 y.o. female with no significant past medical history who complains of sore throat and sores in her mouth on her gums and tongue and back of the throat since yesterday, and this is been a recurrent issue for the past 3 weeks.  She also complains of abdominal cramps after eating and having postprandial diarrhea.  She has nausea but no vomiting.  No radiating pain.  No body aches, no joint pain fevers or chills.  No rash but she has noticed increased amount of pustular eruptions on the face.  She saw her doctor who did some tests.  Talking the patient, it sounds like an ANA test may have been performed which came back abnormal.  She reports her doctor was planning to refer her to a rheumatologist      History reviewed. No pertinent past medical history.   Patient Active Problem List   Diagnosis Date Noted  . Fall 09/19/2016     Past Surgical History:  Procedure Laterality Date  . CESAREAN SECTION       Prior to Admission medications   Medication Sig Start Date End Date Taking? Authorizing Provider  acetaminophen (TYLENOL) 325 MG tablet Take 650 mg by mouth every 6 (six) hours as needed. 12/22/16   [provider]  ferrous sulfate (FERROUSUL) 325 (65 FE) MG tablet Take 325 mg by mouth 2 (two) times daily with a meal. 12/22/16 12/22/17  [provider]  gabapentin (NEURONTIN) 300 MG capsule Take 300 mg by mouth 3 (three) times daily. 12/22/16 01/21/17  [provider]  HYDROcodone-acetaminophen (NORCO/VICODIN) 5-325 MG per tablet Take 1 tablet by mouth every 4 (four) hours as needed for moderate pain. Patient not taking: Reported on  09/19/2016 04/01/15   Harvest Dark, MD  oxyCODONE-acetaminophen (ROXICET) 5-325 MG tablet Take 1 tablet by mouth every 6 (six) hours as needed for severe pain. 12/29/16   Delman Kitten, MD  Prenatal Vit-Fe Fumarate-FA (PRENATAL MULTIVITAMIN) TABS tablet Take 1 tablet by mouth daily at 12 noon.    [provider]  sulfamethoxazole-trimethoprim (BACTRIM DS,SEPTRA DS) 800-160 MG per tablet Take 1 tablet by mouth 2 (two) times daily. Patient not taking: Reported on 09/19/2016 12/17/14   Sable Feil, PA-C  triamcinolone ointment (KENALOG) 0.5 % Apply 1 application topically 2 (two) times daily. 08/30/18   Carrie Mew, MD     Allergies Asa [aspirin]   No family history on file.  Social History Social History   Tobacco Use  . Smoking status: Current Some Day Smoker    Types: Cigarettes  . Smokeless tobacco: Never Used  Substance Use Topics  . Alcohol use: No  . Drug use: No    Review of Systems  Constitutional:   No fever or chills.  ENT:   Positive sore throat. No rhinorrhea. Cardiovascular:   No chest pain or syncope. Respiratory:   No dyspnea or cough. Gastrointestinal:   Negative for abdominal pain, vomiting and diarrhea.  Musculoskeletal:   Negative for focal pain or swelling All other systems reviewed and are negative except as documented above in ROS and HPI.  ____________________________________________   PHYSICAL EXAM:  VITAL SIGNS: ED Triage Vitals  Enc Vitals Group  BP 08/29/18 1913 123/76     Pulse Rate 08/29/18 1913 93     Resp 08/29/18 1913 20     Temp 08/29/18 1913 98.4 F (36.9 C)     Temp Source 08/29/18 1913 Oral     SpO2 08/29/18 1913 100 %     Weight 08/29/18 1915 235 lb (106.6 kg)     Height 08/29/18 1915 5' 1"  (1.549 m)     Head Circumference --      Peak Flow --      Pain Score 08/29/18 1914 10     Pain Loc --      Pain Edu? --      Excl. in [place]? --     Vital signs reviewed, nursing assessments  reviewed.   Constitutional:   Alert and oriented. Non-toxic appearance. Eyes:   Conjunctivae are normal. EOMI. PERRL. ENT      Head:   Normocephalic and atraumatic.      Nose:   No congestion/rhinnorhea.       Mouth/Throat:   MMM, no pharyngeal erythema. No peritonsillar mass.  A few small aphthous ulcers in the oral cavity, including posterior to the upper right molars and on the inferior surface of the tip of the tongue.      Neck:   No meningismus. Full ROM. Hematological/Lymphatic/Immunilogical:   No cervical lymphadenopathy.  Slight shotty axillary lymphadenopathy.  Examined with nurse Allie at bedside Cardiovascular:   RRR. Symmetric bilateral radial and DP pulses.  No murmurs. Cap refill less than 2 seconds. Respiratory:   Normal respiratory effort without tachypnea/retractions. Breath sounds are clear and equal bilaterally. No wheezes/rales/rhonchi. Gastrointestinal:   Soft and nontender. Non distended. There is no CVA tenderness.  No rebound, rigidity, or guarding. Musculoskeletal:   Normal range of motion in all extremities. No joint effusions.  No lower extremity tenderness.  No edema. Neurologic:   Normal speech and language.  Motor grossly intact. No acute focal neurologic deficits are appreciated.  Skin:    Skin is warm, dry and intact. No rash noted.  No petechiae, purpura, or bullae.  There are multiple erythematous papules on the face.  ____________________________________________    LABS (pertinent positives/negatives) (all labs ordered are listed, but only abnormal results are displayed) Labs Reviewed  COMPREHENSIVE METABOLIC PANEL - Abnormal; Notable for the following components:      Result Value   Calcium 8.6 (*)    All other components within normal limits  CBC - Abnormal; Notable for the following components:   Platelets 427 (*)    All other components within normal limits  URINALYSIS, COMPLETE (UACMP) WITH MICROSCOPIC - Abnormal; Notable for the following  components:   Color, Urine YELLOW (*)    APPearance HAZY (*)    Hgb urine dipstick LARGE (*)    Ketones, ur 5 (*)    Leukocytes,Ua TRACE (*)    Bacteria, UA RARE (*)    All other components within normal limits  GROUP A STREP BY PCR  LIPASE, BLOOD  URINALYSIS, COMPLETE (UACMP) WITH MICROSCOPIC  POC URINE PREG, ED  POCT PREGNANCY, URINE   ____________________________________________   EKG    ____________________________________________    RADIOLOGY  No results found.  ____________________________________________   PROCEDURES Procedures  ____________________________________________    CLINICAL IMPRESSION / ASSESSMENT AND PLAN / ED COURSE  Medications ordered in the ED: Medications - No data to display  Pertinent labs & imaging results that were available during my care of the patient were reviewed by  me and considered in my medical decision making (see chart for details).    Patient presents with nonspecific aphthous ulcers of the mouth, may be reactive or viral, possibly due to underlying autoimmune or inflammatory condition.  She is under outpatient work-up which is appropriate at this time.  Today's vitals and labs are all unremarkable, and I will prescribe her topical steroids to help with symptomatic relief of the lesions until she can follow-up with rheumatology.  No evidence of Stevens-Johnson syndrome or significant GI bleed or IBD.  Suggested she try Imodium before meals.  Counseled her to be on the look out for pathergy at the site of today's lab draw in her left antecubital fossa.      ____________________________________________   FINAL CLINICAL IMPRESSION(S) / ED DIAGNOSES    Final diagnoses:  Oral aphthous ulcer     ED Discharge Orders         Ordered    triamcinolone ointment (KENALOG) 0.5 %  2 times daily     08/30/18 0017          Portions of this note were generated with dragon dictation software. Dictation errors may occur  despite best attempts at proofreading.   Carrie Mew, MD 08/30/18 Laureen Abrahams

## 2018-09-12 DIAGNOSIS — R229 Localized swelling, mass and lump, unspecified: Secondary | ICD-10-CM | POA: Insufficient documentation

## 2018-09-16 ENCOUNTER — Other Ambulatory Visit: Payer: Self-pay | Admitting: Internal Medicine

## 2018-09-16 ENCOUNTER — Other Ambulatory Visit (HOSPITAL_COMMUNITY): Payer: Self-pay | Admitting: Internal Medicine

## 2018-09-16 DIAGNOSIS — R51 Headache: Principal | ICD-10-CM

## 2018-09-16 DIAGNOSIS — R519 Headache, unspecified: Secondary | ICD-10-CM

## 2018-09-23 ENCOUNTER — Other Ambulatory Visit: Payer: Self-pay | Admitting: Internal Medicine

## 2018-09-23 DIAGNOSIS — K529 Noninfective gastroenteritis and colitis, unspecified: Secondary | ICD-10-CM

## 2018-09-23 DIAGNOSIS — R197 Diarrhea, unspecified: Secondary | ICD-10-CM

## 2018-09-24 ENCOUNTER — Ambulatory Visit
Admission: RE | Admit: 2018-09-24 | Discharge: 2018-09-24 | Disposition: A | Discharge status: Home / Self Care | Payer: Self-pay | Source: Ambulatory Visit | Attending: Internal Medicine | Admitting: Internal Medicine

## 2018-09-24 ENCOUNTER — Other Ambulatory Visit: Payer: Self-pay

## 2018-09-24 DIAGNOSIS — R519 Headache, unspecified: Secondary | ICD-10-CM

## 2018-09-24 DIAGNOSIS — R51 Headache: Secondary | ICD-10-CM | POA: Insufficient documentation

## 2018-09-24 MED ORDER — GADOBUTROL 1 MMOL/ML IV SOLN
10.0000 mL | Freq: Once | INTRAVENOUS | Status: AC | PRN
  Administered 2018-09-24: 10 mL via INTRAVENOUS

## 2018-09-30 DIAGNOSIS — Z7952 Long term (current) use of systemic steroids: Secondary | ICD-10-CM | POA: Insufficient documentation

## 2018-10-01 ENCOUNTER — Ambulatory Visit
Admission: RE | Admit: 2018-10-01 | Discharge: 2018-10-01 | Disposition: A | Discharge status: Home / Self Care | Payer: Self-pay | Source: Ambulatory Visit | Attending: Internal Medicine | Admitting: Internal Medicine

## 2018-10-01 ENCOUNTER — Other Ambulatory Visit: Payer: Self-pay

## 2018-10-01 DIAGNOSIS — R197 Diarrhea, unspecified: Secondary | ICD-10-CM | POA: Insufficient documentation

## 2018-10-01 DIAGNOSIS — K529 Noninfective gastroenteritis and colitis, unspecified: Secondary | ICD-10-CM | POA: Insufficient documentation

## 2018-10-01 MED ORDER — IOHEXOL 300 MG/ML  SOLN
100.0000 mL | Freq: Once | INTRAMUSCULAR | Status: AC | PRN
  Administered 2018-10-01: 100 mL via INTRAVENOUS

## 2018-11-27 DIAGNOSIS — R252 Cramp and spasm: Secondary | ICD-10-CM | POA: Insufficient documentation

## 2019-02-21 ENCOUNTER — Emergency Department: Payer: No Typology Code available for payment source

## 2019-02-21 ENCOUNTER — Emergency Department
Admission: EM | Admit: 2019-02-21 | Discharge: 2019-02-21 | Disposition: A | Discharge status: Home / Self Care | Payer: No Typology Code available for payment source | Attending: Emergency Medicine | Admitting: Emergency Medicine

## 2019-02-21 ENCOUNTER — Other Ambulatory Visit: Payer: Self-pay

## 2019-02-21 ENCOUNTER — Encounter: Payer: Self-pay | Admitting: Emergency Medicine

## 2019-02-21 DIAGNOSIS — Y92410 Unspecified street and highway as the place of occurrence of the external cause: Secondary | ICD-10-CM | POA: Diagnosis not present

## 2019-02-21 DIAGNOSIS — M549 Dorsalgia, unspecified: Secondary | ICD-10-CM | POA: Diagnosis not present

## 2019-02-21 DIAGNOSIS — S99912A Unspecified injury of left ankle, initial encounter: Secondary | ICD-10-CM | POA: Diagnosis present

## 2019-02-21 DIAGNOSIS — S9002XA Contusion of left ankle, initial encounter: Secondary | ICD-10-CM | POA: Insufficient documentation

## 2019-02-21 DIAGNOSIS — M25572 Pain in left ankle and joints of left foot: Secondary | ICD-10-CM | POA: Diagnosis not present

## 2019-02-21 DIAGNOSIS — T07XXXA Unspecified multiple injuries, initial encounter: Secondary | ICD-10-CM

## 2019-02-21 DIAGNOSIS — Y999 Unspecified external cause status: Secondary | ICD-10-CM | POA: Insufficient documentation

## 2019-02-21 DIAGNOSIS — Y9389 Activity, other specified: Secondary | ICD-10-CM | POA: Diagnosis not present

## 2019-02-21 DIAGNOSIS — M79641 Pain in right hand: Secondary | ICD-10-CM | POA: Diagnosis not present

## 2019-02-21 DIAGNOSIS — F17210 Nicotine dependence, cigarettes, uncomplicated: Secondary | ICD-10-CM | POA: Diagnosis not present

## 2019-02-21 DIAGNOSIS — M542 Cervicalgia: Secondary | ICD-10-CM | POA: Diagnosis not present

## 2019-02-21 DIAGNOSIS — M79671 Pain in right foot: Secondary | ICD-10-CM | POA: Insufficient documentation

## 2019-02-21 LAB — POCT PREGNANCY, URINE: Preg Test, Ur: NEGATIVE

## 2019-02-21 MED ORDER — HYDROCODONE-ACETAMINOPHEN 5-325 MG PO TABS
1.0000 | ORAL_TABLET | Freq: Once | ORAL | Status: AC
  Administered 2019-02-21: 1 via ORAL
  Filled 2019-02-21: qty 1

## 2019-02-21 MED ORDER — HYDROCODONE-ACETAMINOPHEN 5-325 MG PO TABS: 1.0000 | ORAL_TABLET | Freq: Four times a day (QID) | ORAL | 0 refills | Status: DC | PRN
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
## 2019-02-21 NOTE — Discharge Instructions (Addendum)
Follow-up with your primary care provider if any continued problems or continued concerns.  Ice and elevate your foot and ankle along with your hand to prevent increased swelling.  This will help with pain.  Also prescription for Norco was sent to your pharmacy.  This medication is a narcotic and should be taken only if you are not driving or operating machinery.
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
## 2019-05-15 DIAGNOSIS — K120 Recurrent oral aphthae: Secondary | ICD-10-CM | POA: Insufficient documentation

## 2019-05-16 ENCOUNTER — Encounter: Payer: Self-pay | Admitting: *Deleted

## 2019-05-19 ENCOUNTER — Encounter: Admission: RE | Payer: Self-pay | Source: Home / Self Care

## 2019-05-19 ENCOUNTER — Ambulatory Visit: Admission: RE | Admit: 2019-05-19 | Payer: Medicaid Other | Source: Home / Self Care | Admitting: Internal Medicine

## 2019-05-19 SURGERY — SIGMOIDOSCOPY, FLEXIBLE

## 2019-07-24 DIAGNOSIS — F17210 Nicotine dependence, cigarettes, uncomplicated: Secondary | ICD-10-CM | POA: Insufficient documentation

## 2019-07-24 DIAGNOSIS — K625 Hemorrhage of anus and rectum: Secondary | ICD-10-CM | POA: Diagnosis present

## 2019-07-24 DIAGNOSIS — Z79899 Other long term (current) drug therapy: Secondary | ICD-10-CM | POA: Insufficient documentation

## 2019-07-24 DIAGNOSIS — K50111 Crohn's disease of large intestine with rectal bleeding: Secondary | ICD-10-CM | POA: Insufficient documentation

## 2019-07-24 NOTE — ED Triage Notes (Signed)
Pt states she has hx of crohn's and colitis, here for rectal bleeding since yesterday. No hx of the same, co ruq pain and rectal pain.

## 2019-07-25 ENCOUNTER — Other Ambulatory Visit: Payer: Self-pay

## 2019-07-25 ENCOUNTER — Emergency Department: Payer: Medicaid Other

## 2019-07-25 ENCOUNTER — Emergency Department
Admission: EM | Admit: 2019-07-25 | Discharge: 2019-07-25 | Disposition: A | Discharge status: Home / Self Care | Payer: Medicaid Other | Attending: Emergency Medicine | Admitting: Emergency Medicine

## 2019-07-25 DIAGNOSIS — K50111 Crohn's disease of large intestine with rectal bleeding: Secondary | ICD-10-CM

## 2019-07-25 LAB — COMPREHENSIVE METABOLIC PANEL
ALT: 37 U/L (ref 0–44)
AST: 26 U/L (ref 15–41)
Albumin: 4.1 g/dL (ref 3.5–5.0)
Alkaline Phosphatase: 105 U/L (ref 38–126)
Anion gap: 13 (ref 5–15)
BUN: 10 mg/dL (ref 6–20)
CO2: 23 mmol/L (ref 22–32)
Calcium: 9.5 mg/dL (ref 8.9–10.3)
Chloride: 100 mmol/L (ref 98–111)
Creatinine, Ser: 0.83 mg/dL (ref 0.44–1.00)
GFR calc Af Amer: >60 mL/min (ref >60)
GFR calc non Af Amer: >60 mL/min (ref >60)
Glucose, Bld: 104 mg/dL — ABNORMAL HIGH (ref 70–99)
Potassium: 3.6 mmol/L (ref 3.5–5.1)
Sodium: 136 mmol/L (ref 135–145)
Total Bilirubin: 0.5 mg/dL (ref 0.3–1.2)
Total Protein: 8.4 g/dL — ABNORMAL HIGH (ref 6.5–8.1)

## 2019-07-25 LAB — CBC
HCT: 41.8 % (ref 36.0–46.0)
Hemoglobin: 13.5 g/dL (ref 12.0–15.0)
MCH: 29.8 pg (ref 26.0–34.0)
MCHC: 32.3 g/dL (ref 30.0–36.0)
MCV: 92.3 fL (ref 80.0–100.0)
Platelets: 409 10*3/uL — ABNORMAL HIGH (ref 150–400)
RBC: 4.53 MIL/uL (ref 3.87–5.11)
RDW: 13 % (ref 11.5–15.5)
WBC: 9.5 10*3/uL (ref 4.0–10.5)
nRBC: 0 % (ref 0.0–0.2)

## 2019-07-25 LAB — URINALYSIS, COMPLETE (UACMP) WITH MICROSCOPIC
Bacteria, UA: NONE SEEN
Bilirubin Urine: NEGATIVE
Glucose, UA: NEGATIVE mg/dL
Hgb urine dipstick: NEGATIVE
Ketones, ur: NEGATIVE mg/dL
Nitrite: NEGATIVE
Protein, ur: 30 mg/dL — AB
Specific Gravity, Urine: 1.031 — ABNORMAL HIGH (ref 1.005–1.030)
WBC, UA: >50 WBC/hpf — ABNORMAL HIGH (ref 0–5)
pH: 5 (ref 5.0–8.0)

## 2019-07-25 LAB — LIPASE, BLOOD: Lipase: 20 U/L (ref 11–51)

## 2019-07-25 LAB — POCT PREGNANCY, URINE: Preg Test, Ur: NEGATIVE

## 2019-07-25 MED ORDER — IOHEXOL 9 MG/ML PO SOLN: 500.0000 mL | Freq: Once | ORAL | Status: DC | PRN

## 2019-07-25 MED ORDER — LIDOCAINE VISCOUS HCL 2 % MT SOLN
15.0000 mL | Freq: Once | OROMUCOSAL | Status: AC
  Administered 2019-07-25: 15 mL via OROMUCOSAL
  Filled 2019-07-25: qty 15

## 2019-07-25 MED ORDER — DEXAMETHASONE SODIUM PHOSPHATE 10 MG/ML IJ SOLN
10.0000 mg | Freq: Once | INTRAMUSCULAR | Status: AC
  Administered 2019-07-25: 10 mg via INTRAVENOUS
  Filled 2019-07-25: qty 1

## 2019-07-25 MED ORDER — PREDNISONE 20 MG PO TABS: 60.0000 mg | ORAL_TABLET | Freq: Every day | ORAL | 0 refills | Status: AC

## 2019-07-25 MED ORDER — IOHEXOL 350 MG/ML SOLN
100.0000 mL | Freq: Once | INTRAVENOUS | Status: AC | PRN
  Administered 2019-07-25: 07:00:00 100 mL via INTRAVENOUS

## 2019-07-25 MED ORDER — OXYCODONE-ACETAMINOPHEN 5-325 MG PO TABS: 1.0000 | ORAL_TABLET | ORAL | 0 refills | Status: DC | PRN

## 2019-07-25 MED ORDER — ONDANSETRON 4 MG PO TBDP: 4.0000 mg | ORAL_TABLET | Freq: Three times a day (TID) | ORAL | 0 refills | Status: DC | PRN

## 2019-07-25 NOTE — ED Notes (Signed)
Pt done with contrast, CT aware.

## 2019-07-25 NOTE — ED Notes (Signed)
Pt to ct 

## 2019-07-25 NOTE — ED Notes (Addendum)
Pt in with co rectal bleeding that started yesterday states large amts states has noted clots. No hx of the same, states has noted a "buldge" to rectum with rectal pain when she has BM. No hx of hemorrhoids but did use otc suppository without relief. Does have lower abd pain, denies dysuria.

## 2019-07-25 NOTE — ED Provider Notes (Signed)
[facility] Emergency Department Provider Note  ____________________________________________   First MD Initiated Contact with Patient 07/25/19 0321     (approximate)  I have reviewed the triage vital signs and the nursing notes.   HISTORY  Chief Complaint Rectal Bleeding   HPI Penny Hardy is a 39 y.o. female with history of Crohn's disease and ulcerative colitis presents to the emergency department secondary to rectal bleeding which began yesterday.  Patient describes multiple episodes of bright red blood per rectum with clots as well.  Patient also admits to anal pain.  Patient denies any fever afebrile on presentation.  Patient does admit to abdominal discomfort with current pain score 10 out of 10.  Patient denies any nausea or vomiting.        History reviewed. No pertinent past medical history.  Patient Active Problem List   Diagnosis Date Noted  . Fall 09/19/2016    Past Surgical History:  Procedure Laterality Date  . CESAREAN SECTION      Prior to Admission medications   Medication Sig Start Date End Date Taking? Authorizing Provider  acetaminophen (TYLENOL) 325 MG tablet Take 650 mg by mouth every 6 (six) hours as needed. 12/22/16   [provider]  ferrous sulfate (FERROUSUL) 325 (65 FE) MG tablet Take 325 mg by mouth 2 (two) times daily with a meal. 12/22/16 12/22/17  [provider]  gabapentin (NEURONTIN) 300 MG capsule Take 300 mg by mouth 3 (three) times daily. 12/22/16 01/21/17  [provider]  HYDROcodone-acetaminophen (NORCO/VICODIN) 5-325 MG tablet Take 1 tablet by mouth every 6 (six) hours as needed for moderate pain. 02/21/19   Johnn Hai, PA-C  ondansetron (ZOFRAN ODT) 4 MG disintegrating tablet Take 1 tablet (4 mg total) by mouth every 8 (eight) hours as needed. 07/25/19   Gregor Hams, MD  oxyCODONE-acetaminophen (PERCOCET) 5-325 MG tablet Take 1 tablet by mouth every 4 (four) hours as  needed. 07/25/19 07/24/20  Gregor Hams, MD  predniSONE (DELTASONE) 20 MG tablet Take 3 tablets (60 mg total) by mouth daily for 5 days. 07/25/19 07/30/19  Gregor Hams, MD  triamcinolone ointment (KENALOG) 0.5 % Apply 1 application topically 2 (two) times daily. 08/30/18   Carrie Mew, MD    Allergies Asa [aspirin] and Ibuprofen  No family history on file.  Social History Social History   Tobacco Use  . Smoking status: Current Some Day Smoker    Types: Cigarettes  . Smokeless tobacco: Never Used  Substance Use Topics  . Alcohol use: No  . Drug use: No    Review of Systems Constitutional: No fever/chills Eyes: No visual changes. ENT: No sore throat. Cardiovascular: Denies chest pain. Respiratory: Denies shortness of breath. Gastrointestinal: Positive for abdominal pain, anal pain. Genitourinary: Negative for dysuria. Musculoskeletal: Negative for neck pain.  Negative for back pain. Integumentary: Negative for rash. Neurological: Negative for headaches, focal weakness or numbness.   ____________________________________________   PHYSICAL EXAM:  VITAL SIGNS: ED Triage Vitals  Enc Vitals Group     BP 07/25/19 0001 (!) 120/92     Pulse Rate 07/25/19 0001 (!) 120     Resp 07/25/19 0001 20     Temp 07/25/19 0001 98.1 F (36.7 C)     Temp Source 07/25/19 0001 Oral     SpO2 07/25/19 0001 100 %     Weight 07/25/19 0002 111.1 kg (245 lb)     Height 07/25/19 0002 1.575 m (5' 2" )  Head Circumference --      Peak Flow --      Pain Score 07/25/19 0001 10     Pain Loc --      Pain Edu? --      Excl. in [place]? --     Constitutional: Alert and oriented.  Eyes: Conjunctivae are normal.  Mouth/Throat: Patient is wearing a mask. Neck: No stridor.  No meningeal signs.   Cardiovascular: Normal rate, regular rhythm. Good peripheral circulation. Grossly normal heart sounds. Respiratory: Normal respiratory effort.  No retractions. Gastrointestinal: Left lower  quadrant tenderness to palpation/right upper quadrant tenderness. No distention.  Rectal: Hemorrhoid noted at the 6 o'clock position with adjacent fissure Musculoskeletal: No lower extremity tenderness nor edema. No gross deformities of extremities. Neurologic:  Normal speech and language. No gross focal neurologic deficits are appreciated.  Skin:  Skin is warm, dry and intact. Psychiatric: Mood and affect are normal. Speech and behavior are normal.  ____________________________________________   LABS (all labs ordered are listed, but only abnormal results are displayed)  Labs Reviewed  CBC - Abnormal; Notable for the following components:      Result Value   Platelets 409 (*)    All other components within normal limits  COMPREHENSIVE METABOLIC PANEL - Abnormal; Notable for the following components:   Glucose, Bld 104 (*)    Total Protein 8.4 (*)    All other components within normal limits  URINALYSIS, COMPLETE (UACMP) WITH MICROSCOPIC - Abnormal; Notable for the following components:   Color, Urine AMBER (*)    APPearance CLOUDY (*)    Specific Gravity, Urine 1.031 (*)    Protein, ur 30 (*)    Leukocytes,Ua MODERATE (*)    WBC, UA >50 (*)    All other components within normal limits  LIPASE, BLOOD  POC URINE PREG, ED  POCT PREGNANCY, URINE   ____________________  RADIOLOGY I, [place] Yamilka Lopiccolo, personally viewed and evaluated these images (plain radiographs) as part of my medical decision making, as well as reviewing the written report by the radiologist.  ED MD interpretation:    CLINICAL DATA:  Acute nonlocalized abdominal pain. History of inflammatory bowel disease. Rectal bleeding  EXAM: CT ABDOMEN AND PELVIS WITH CONTRAST  TECHNIQUE: Multidetector CT imaging of the abdomen and pelvis was performed using the standard protocol following bolus administration of intravenous contrast.  CONTRAST:  154m OMNIPAQUE IOHEXOL 350 MG/ML SOLN  COMPARISON:   12/01/2018  FINDINGS: Lower chest:  No contributory findings.  Hepatobiliary: Hepatic steatosis.Cholelithiasis, specifically a large lamellated gallstone in the body. No evidence of inflammation. No biliary dilatation.  Pancreas: Unremarkable.  Spleen: Unremarkable.  Adrenals/Urinary Tract: Negative adrenals. No hydronephrosis or stone. Unremarkable bladder.  Stomach/Bowel: No evidence of enteritis or colitis when accounting for areas of colonic under distension. Rare left colonic diverticulosis. No appendicitis.  Vascular/Lymphatic: No acute vascular abnormality. No mass or adenopathy.  Reproductive:IUD in expected position.  Other: No ascites or pneumoperitoneum.  Small fatty umbilical hernia  Musculoskeletal: No acute abnormalities.  Osteitis condensans iliac.  IMPRESSION: 1. No acute finding. No evidence of active inflammatory bowel disease. 2. Few colonic diverticula. 3. Hepatic steatosis and cholelithiasis. 4. Fatty umbilical hernia.   Electronically Signed   By: JMonte FantasiaM.D.   On: 07/25/2019 06:53 Official radiology report(s): No results found.    Procedures   ____________________________________________   INITIAL IMPRESSION / MDM / ASSESSMENT AND PLAN / ED COURSE  As part of my medical decision making, I reviewed the following data  within the [place] NUMBER    39 year old female presented with above-stated history and physical exam differential diagnosis including Crohn's/UC exacerbation, rectal fistula/fissure as such CT abdomen pelvis performed which revealed no acute intra-abdominal or pelvic pathology.  Suspect Crohn's/UC exacerbation for the patient symptoms.  Patient given IV Decadron 10 mg in the emergency department will prescribe prednisone for home.  Patient will be referred to gastroenterology Dr. Marius Ditch for further outpatient evaluation.     ____________________________________________  FINAL CLINICAL  IMPRESSION(S) / ED DIAGNOSES  Final diagnoses:  Crohn's disease of colon with rectal bleeding ([place])     MEDICATIONS GIVEN DURING THIS VISIT:  Medications  dexamethasone (DECADRON) injection 10 mg (10 mg Intravenous Given 07/25/19 0412)  lidocaine (XYLOCAINE) 2 % viscous mouth solution 15 mL (15 mLs Mouth/Throat Given 07/25/19 0411)  iohexol (OMNIPAQUE) 350 MG/ML injection 100 mL (100 mLs Intravenous Contrast Given 07/25/19 6553)     ED Discharge Orders         Ordered    predniSONE (DELTASONE) 20 MG tablet  Daily     07/25/19 0724    oxyCODONE-acetaminophen (PERCOCET) 5-325 MG tablet  Every 4 hours PRN     07/25/19 0724    ondansetron (ZOFRAN ODT) 4 MG disintegrating tablet  Every 8 hours PRN     07/25/19 0724          *Please note:  NAOKO DIPERNA was evaluated in Emergency Department on 07/28/2019 for the symptoms described in the history of present illness. She was evaluated in the context of the global COVID-19 pandemic, which necessitated consideration that the patient might be at risk for infection with the SARS-CoV-2 virus that causes COVID-19. Institutional protocols and algorithms that pertain to the evaluation of patients at risk for COVID-19 are in a state of rapid change based on information released by regulatory bodies including the CDC and federal and state organizations. These policies and algorithms were followed during the patient's care in the ED.  Some ED evaluations and interventions may be delayed as a result of limited staffing during the pandemic.*  Note:  This document was prepared using Dragon voice recognition software and may include unintentional dictation errors.   Gregor Hams, MD 07/28/19 [phone]

## 2019-07-25 NOTE — ED Notes (Signed)
Pt drinking oral contrast.

## 2019-08-07 ENCOUNTER — Other Ambulatory Visit: Payer: Self-pay

## 2019-08-07 ENCOUNTER — Encounter: Payer: Self-pay | Admitting: Internal Medicine

## 2019-08-07 ENCOUNTER — Encounter: Payer: Self-pay | Admitting: Gastroenterology

## 2019-08-07 ENCOUNTER — Ambulatory Visit: Payer: Medicaid Other | Admitting: Gastroenterology

## 2019-08-07 VITALS — BP 105/75 | HR 85 | Temp 97.9°F | Resp 17 | Wt 246.8 lb

## 2019-08-07 DIAGNOSIS — K50919 Crohn's disease, unspecified, with unspecified complications: Secondary | ICD-10-CM | POA: Diagnosis not present

## 2019-08-07 DIAGNOSIS — K604 Rectal fistula: Secondary | ICD-10-CM

## 2019-08-07 DIAGNOSIS — K523 Indeterminate colitis: Secondary | ICD-10-CM | POA: Diagnosis not present

## 2019-08-07 DIAGNOSIS — K1379 Other lesions of oral mucosa: Secondary | ICD-10-CM

## 2019-08-07 MED ORDER — PREDNISONE 5 MG/5ML PO SOLN: 20.0000 mg | Freq: Every day | ORAL | 0 refills | Status: AC

## 2019-08-07 NOTE — Progress Notes (Signed)
Cephas Darby, MD [address]  [place]  [place], [place] [address]  Main: [phone]  Fax: [phone]    Gastroenterology Consultation  Referring Provider:     Freddy Finner, NP Primary Care Physician:  Freddy Finner, NP Primary Gastroenterologist:  Dr. Alice Reichert Reason for Consultation:  ?IBD        HPI:   Penny Hardy is a 39 y.o. female referred by Dr. Freddy Finner, NP  for consultation & management of recent history of rectal bleeding and diarrhea.  Patient went to [facility] ER on 07/25/2019 due to 2-3 episodes of bright red bleeding per rectum associated with diarrhea.  She underwent CT abdomen and pelvis with contrast which were unremarkable.  CBC, CMP, serum lipase, UA, urine pregnancy test were unremarkable.  She was discharged home on prednisone 60 mg given her history of inflammatory bowel disease.  Patient was taking 10 mg daily.  She reports that her rectal bleeding and diarrhea have improved.  She decided to change her gastroenterologist for further care of her GI condition.   Since early 2020, patient was experiencing multiple painful oral ulcers, painful swallowing as well as large joint arthralgias, low-grade fever as well as 1 month of nonbloody diarrhea, associated with abdominal pain, nausea and vomiting, fecal urgency as well as bright red blood per rectum with watery diarrhea.  She was initially seen by rheumatologist, at [facility] clinic and she was told that she may have underlying Crohn's disease or Behcet's and was started on prednisone 60 mg daily.  Her diarrhea has resolved since starting prednisone.  She also had history of painful vaginal and anal ulcers and was tested negative for HSV.  Subsequently, in 09/2018, patient was seen by Dr. Alice Reichert to evaluate for inflammatory bowel disease given her history of GI symptoms.  She had elevated CRP 18, ESR 61.  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  Apparently, patient was managed on prednisone by her rheumatologist Dr. Annalee Genta.  After endoscopy evaluation by Dr. Alice Reichert, she was given diagnosis of Crohn's disease and was recommended to start Entocort in early April 2020.  She was taking budesonide 9 mg daily and prednisone tapered down to 10 mg.  Her diarrhea recurred on Entocort, therefore she was started on mesalamine enema as well as Asacol 1.6 g 3 times a day.  Since she could not afford Asacol, it was switched to Lialda 1.2 g 4 times a day.  Patient could not tolerate mesalamine enema.  Patient reports that her GI symptoms improved on Lialda until recently when she went to [facility] ER on 1/15 due to rectal bleeding, right upper quadrant pain.  She was told by the ER physician to stop mesalamine, started on prednisone 60 mg daily and was referred to [facility] as patient wished to change her gastroenterologist.  Today she is concerned about recurrence of painful oral ulcers, ongoing loose watery bowel movements, scant blood mixed with the stool and not feeling well.  Patient is asking about Remicade as it was mentioned to her by Dr. Owens Shark, ER physician during recent ER visit.  She says mesalamine does not work anymore for her.  Patient gained about 13 pounds since initiation of prednisone in 09/2018 to date. She does smoke cigarettes regularly She denies alcohol use Patient became emotional during her appointment today due to ongoing symptoms and not able to find a good treatment yet  NSAIDs: None  Antiplts/Anticoagulants/Anti  thrombotics: None  GI Procedures:  EGD 09/23/2018 The examined esophagus was normal Patchy minimal inflammation characterized by erythema was found in the gastric body and in the gastric antrum, biopsies were performed.  Normal cardia and gastric fundus on retroflexion.  The examined duodenum was normal.  Biopsies were performed to evaluate for celiac disease. Pathology Duodenal biopsy  revealed duodenal mucosa with focal gastric metaplasia, no other abnormalities.  Stomach biopsy: Mild to moderate chronic gastritis, negative for Helicobacter pylori  Colonoscopy 09/23/2018 The perianal and digital rectal exam were normal.  A small fistula was found in the distal rectum.  Nonbleeding internal hemorrhoids on retroflexion.  An area of moderately congested mucosa was found in the rectum, sigmoid colon and the descending colon and in the ascending colon.  Biopsies were taken for histology.  Terminal ileum was normal.  Biopsies were taken.  The transverse colon and cecum appeared normal.  Biopsies were taken.  Few small mouth diverticula were found in the sigmoid colon Pathology terminal ileum: Ileal mucosa with no diagnostic abnormalities, no evidence of inflammatory bowel disease.  Cecum: No evidence of active inflammatory bowel disease, colonic mucosa with no diagnostic abnormalities, negative for dysplasia Colon biopsy, ascending colon: Mucosa with architectural distortion compatible with prior mucosal damage.  No evidence of active idiopathic inflammatory bowel disease.  Colon biopsy, transverse colon: Mucosa with no diagnostic abnormalities, no evidence of active inflammatory bowel disease.  Colon biopsy descending colon: Focal active colitis, negative for dysplasia or malignancy.  Colon biopsy sigmoid colon and rectum: Focal active cryptitis, negative for dysplasia or malignancy  No past medical history on file.  Past Surgical History:  Procedure Laterality Date  . CESAREAN SECTION      Current Outpatient Medications:  .  acetaminophen (TYLENOL) 325 MG tablet, Take 650 mg by mouth every 6 (six) hours as needed., Disp: , Rfl:  .  cetirizine (ZYRTEC) 10 MG tablet, Take by mouth., Disp: , Rfl:  .  phentermine 30 MG capsule, Take by mouth., Disp: , Rfl:  .  ferrous sulfate (FERROUSUL) 325 (65 FE) MG tablet, Take 325 mg by mouth 2 (two) times daily with a meal., Disp: , Rfl:  .   mesalamine (LIALDA) 1.2 g EC tablet, Take by mouth., Disp: , Rfl:  .  mesalamine (ROWASA) 4 g enema, Place rectally., Disp: , Rfl:  .  ondansetron (ZOFRAN ODT) 4 MG disintegrating tablet, Take 1 tablet (4 mg total) by mouth every 8 (eight) hours as needed. (Patient not taking: Reported on 08/07/2019), Disp: 20 tablet, Rfl: 0 .  Oxycodone HCl 10 MG TABS, Take by mouth., Disp: , Rfl:  .  oxyCODONE-acetaminophen (PERCOCET) 5-325 MG tablet, Take 1 tablet by mouth every 4 (four) hours as needed. (Patient not taking: Reported on 08/07/2019), Disp: 20 tablet, Rfl: 0 .  predniSONE 5 MG/5ML solution, Take 20 mLs (20 mg total) by mouth daily with breakfast for 14 days., Disp: 280 mL, Rfl: 0   No family history on file.   Social History   Tobacco Use  . Smoking status: Current Some Day Smoker    Types: Cigarettes  . Smokeless tobacco: Never Used  Substance Use Topics  . Alcohol use: No  . Drug use: No    Allergies as of 08/07/2019 - Review Complete 08/07/2019  Allergen Reaction Noted  . Asa [aspirin] Hives and Nausea And Vomiting 12/17/2014  . Ibuprofen Hives and Nausea And Vomiting 10/01/2018  . Pollen extract Other (See Comments) 04/24/2013    Review of Systems:  All systems reviewed and negative except where noted in HPI.   Physical Exam:  BP 105/75 (BP Location: Left Arm, Patient Position: Sitting, Cuff Size: Large)   Pulse 85   Temp 97.9 F (36.6 C)   Resp 17   Wt 246 lb 12.8 oz (111.9 kg)   BMI 45.14 kg/m  No LMP recorded. (Menstrual status: IUD).  General:   Alert,  Well-developed, well-nourished, pleasant and cooperative in NAD Head:  Normocephalic and atraumatic. Eyes:  Sclera clear, no icterus.   Conjunctiva pink. Ears:  Normal auditory acuity. Nose:  No deformity, discharge, or lesions. Mouth:  No deformity or lesions,oropharynx pink & moist. Neck:  Supple; no masses or thyromegaly. Lungs:  Respirations even and unlabored.  Clear throughout to auscultation.   No  wheezes, crackles, or rhonchi. No acute distress. Heart:  Regular rate and rhythm; no murmurs, clicks, rubs, or gallops. Abdomen:  Normal bowel sounds. Soft, morbidly obese, non-tender and non-distended without masses,  No guarding or rebound tenderness.   Rectal: Not performed Msk:  Symmetrical without gross deformities. Good, equal movement & strength bilaterally. Pulses:  Normal pulses noted. Extremities:  No clubbing or edema.  No cyanosis. Neurologic:  Alert and oriented x3;  grossly normal neurologically. Skin:  Intact without significant lesions or rashes. No jaundice. Psych:  Alert and cooperative. Normal mood and affect.  Imaging Studies: Reviewed  Assessment and Plan:   RONNELL MAKAREWICZ is a 39 y.o. female with BMI 45, chronic tobacco use is seen in consultation for approximately 1 year history of oral ulcers, diarrhea with rectal bleeding, distal rectal fistula,?  Inflammatory bowel disease.  I have personally reviewed the upper endoscopy and colonoscopy reports including pathology results.  There is no evidence of upper GI Crohn's.  Based on the colonoscopy report, there was evidence of segmental congestion in the left colon and ascending colon, however histology revealed active colitis/cryptitis only with no evidence of background chronicity.  I am not entirely convinced if patient has inflammatory bowel disease based on the histology.  She may have early onset of de novo IBD/Crohn's disease as she is responding to prednisone I recommend to check fecal calprotectin levels Restart mesalamine 4.8 g daily Taper prednisone Abstinence from tobacco use Recommend to repeat colonoscopy as it has been 1 year to assess for any disease progression, with repeat TI evaluation and biopsies   Follow up in 2 to 3 weeks   Cephas Darby, MD

## 2019-08-19 ENCOUNTER — Ambulatory Visit (INDEPENDENT_AMBULATORY_CARE_PROVIDER_SITE_OTHER): Payer: Medicaid Other | Admitting: Gastroenterology

## 2019-08-19 ENCOUNTER — Encounter: Payer: Self-pay | Admitting: Gastroenterology

## 2019-08-19 ENCOUNTER — Other Ambulatory Visit: Payer: Self-pay

## 2019-08-19 DIAGNOSIS — Z8719 Personal history of other diseases of the digestive system: Secondary | ICD-10-CM

## 2019-08-19 LAB — CALPROTECTIN, FECAL: Calprotectin, Fecal: 781 ug/g — ABNORMAL HIGH (ref 0–120)

## 2019-08-19 NOTE — Progress Notes (Signed)
Please apply for Remicade ASAP, standard induction and maintenance dose every 8 weeks Dx: Crohn's disease  Obtain copy of QuantiFERON gold test from her PCP's office from last year  Discussed about abnormally elevated fecal calprotectin levels with patient.  This is convincing that she has underlying inflammatory bowel disease.  Recommend step up therapy given her systemic symptoms, rectal fistula, hidradenitis suppurativa, erythema nodosum, oral ulcers, left-sided colitis which are not responding to oral mesalamine.  Discussed about Remicade, Humira and Stelara, risks and benefits, side effects of the treatment.  Given that she has Medicaid, and high BMI, would favor Remicade and patient is agreeable.  Also, recommend hepatitis A and B vaccine, Prevnar, Pneumovax and annual influenza vaccine through local health department  Hepatitis B serologies negative in 3/2020QuantiFERON gold is negative in 11/2018  Patient expressed understanding of the plan

## 2019-08-19 NOTE — Progress Notes (Signed)
[name], MD [address]  [place]  [place], [place] [address]  Main: [phone]  Fax: [phone]    Gastroenterology Consultation Tele Visit  Referring Provider:     Freddy Finner, NP Primary Care Physician:  Freddy Finner, NP Primary Gastroenterologist:  Dr. Cephas Darby Reason for Consultation: History of acute colitis, rectal bleeding        HPI:   Penny Hardy is a 39 y.o. female referred by Dr. Freddy Finner, NP  for consultation & management of left-sided colitis, rectal bleeding  Virtual Visit via Telephone Note  I connected with Penny Hardy on 08/19/19 at 10:45 AM EST by telephone and verified that I am speaking with the correct person using two identifiers.   I discussed the limitations, risks, security and privacy concerns of performing an evaluation and management service by telephone and the availability of in person appointments. I also discussed with the patient that there may be a patient responsible charge related to this service. The patient expressed understanding and agreed to proceed.  Location of the Patient: Home  Location of the provider: Office  Persons participating in the visit: Patient and provider only  History of Present Illness: I connected with Penny Hardy on a phone visit today.  Attempted video visit, unable to connect due to malfunction of the software [email or web address] She continues to take prednisone, decreased to 5 mg and noticed flareup of her symptoms including rectal bleeding, diarrhea and large joint arthralgias.  She says overall, she does not feel good.  I have discussed with her that I have reviewed her upper endoscopy and colonoscopy reports including pathology results which are not confirmatory for inflammatory bowel disease.  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  She does have internal hemorrhoids which may also be contributing to the rectal bleeding.  Patient is stressed about not feeling  well and with the diagnosis not clear yet She continues to take mesalamine 4 pills daily   History reviewed. No pertinent past medical history.  Past Surgical History:  Procedure Laterality Date  . CESAREAN SECTION      Current Outpatient Medications:  .  acetaminophen (TYLENOL) 325 MG tablet, Take 650 mg by mouth every 6 (six) hours as needed., Disp: , Rfl:  .  mesalamine (LIALDA) 1.2 g EC tablet, Take by mouth., Disp: , Rfl:  .  ondansetron (ZOFRAN ODT) 4 MG disintegrating tablet, Take 1 tablet (4 mg total) by mouth every 8 (eight) hours as needed., Disp: 20 tablet, Rfl: 0 .  phentermine 30 MG capsule, Take by mouth., Disp: , Rfl:  .  predniSONE 5 MG/5ML solution, Take 20 mLs (20 mg total) by mouth daily with breakfast for 14 days., Disp: 280 mL, Rfl: 0 .  mesalamine (ROWASA) 4 g enema, Place rectally., Disp: , Rfl:    History reviewed. No pertinent family history.   Social History   Tobacco Use  . Smoking status: Current Some Day Smoker    Types: Cigarettes  . Smokeless tobacco: Never Used  Substance Use Topics  . Alcohol use: No  . Drug use: No    Allergies as of 08/19/2019 - Review Complete 08/19/2019  Allergen Reaction Noted  . Asa [aspirin] Hives and Nausea And Vomiting 12/17/2014  . Ibuprofen Hives and Nausea And Vomiting 10/01/2018  . Pollen extract Other (See Comments) 04/24/2013    Imaging Studies: Reviewed  Assessment and Plan:   Penny Hardy is a 39 y.o. female with  BMI 45, chronic tobacco use is seen for follow-up of chronic history of oral ulcers, diarrhea with rectal bleeding, distal rectal fistula, acute left-sided colitis.  Given her symptoms are steroid responsive, recommend repeat flexible sigmoidoscopy to evaluate left colon and repeat biopsies to look for any chronicity. She will continue prednisone 10 mg daily Continue mesalamine 4 mg daily Fecal calprotectin levels are in process Patient agreed to undergo flexible sigmoidoscopy Abstinence  from tobacco use   Follow Up Instructions:   I discussed the assessment and treatment plan with the patient. The patient was provided an opportunity to ask questions and all were answered. The patient agreed with the plan and demonstrated an understanding of the instructions.   The patient was advised to call back or seek an in-person evaluation if the symptoms worsen or if the condition fails to improve as anticipated.  I provided 15 minutes of non-face-to-face time during this encounter.   Follow up in 4 weeks   Cephas Darby, MD

## 2019-08-20 ENCOUNTER — Telehealth: Payer: Self-pay

## 2019-08-20 NOTE — Telephone Encounter (Signed)
I have faxed request to Keri with [facility] to get the Remicade. Printed off the Hep B results and faxed with application. Have tried to call 3 times to PCP office and can not get through to get the Quantiferon  gold test faxed to [place]

## 2019-08-20 NOTE — Telephone Encounter (Signed)
-----   Message from Lin Landsman, MD sent at 08/19/2019  5:23 PM EST ----- Please apply for Remicade ASAP, standard induction and maintenance dose every 8 weeks Dx: Crohn's disease  Obtain copy of QuantiFERON gold test from her PCP's office from last year  Discussed about abnormally elevated fecal calprotectin levels with patient.  This is convincing that she has underlying inflammatory bowel disease.  Recommend step up therapy given her systemic symptoms, rectal fistula, hidradenitis suppurativa, erythema nodosum, oral ulcers, left-sided colitis which are not responding to oral mesalamine.  Discussed about Remicade, Humira and Stelara, risks and benefits, side effects of the treatment.  Given that she has Medicaid, and high BMI, would favor Remicade and patient is agreeable.  Also, recommend hepatitis A and B vaccine, Prevnar, Pneumovax and annual influenza vaccine through local health department  Hepatitis B serologies negative in 3/2020QuantiFERON gold is negative in 11/2018  Patient expressed understanding of the plan

## 2019-08-20 NOTE — Telephone Encounter (Signed)
Go in touch with PCP office and they are faxing over the Quantiferon gold test to our office now.

## 2019-08-21 NOTE — Telephone Encounter (Signed)
Faxed Quantiferon gold test to [employer]

## 2019-08-26 ENCOUNTER — Encounter: Payer: Self-pay | Admitting: Primary Care

## 2019-08-28 ENCOUNTER — Other Ambulatory Visit
Admission: RE | Admit: 2019-08-28 | Discharge: 2019-08-28 | Disposition: A | Discharge status: Home / Self Care | Payer: Medicaid Other | Source: Ambulatory Visit | Attending: Gastroenterology | Admitting: Gastroenterology

## 2019-08-28 DIAGNOSIS — Z20822 Contact with and (suspected) exposure to covid-19: Secondary | ICD-10-CM | POA: Insufficient documentation

## 2019-08-28 DIAGNOSIS — Z01812 Encounter for preprocedural laboratory examination: Secondary | ICD-10-CM | POA: Insufficient documentation

## 2019-08-28 LAB — SARS CORONAVIRUS 2 (TAT 6-24 HRS): SARS Coronavirus 2: NEGATIVE

## 2019-08-29 ENCOUNTER — Telehealth: Payer: Self-pay | Admitting: Gastroenterology

## 2019-08-29 ENCOUNTER — Encounter: Payer: Self-pay | Admitting: Gastroenterology

## 2019-08-29 ENCOUNTER — Other Ambulatory Visit: Payer: Self-pay | Admitting: Gastroenterology

## 2019-08-29 DIAGNOSIS — K523 Indeterminate colitis: Secondary | ICD-10-CM

## 2019-08-29 MED ORDER — PREDNISONE 5 MG PO TABS: 15.0000 mg | ORAL_TABLET | Freq: Every day | ORAL | 0 refills | Status: AC

## 2019-08-29 MED ORDER — ONDANSETRON HCL 4 MG PO TABS: 4.0000 mg | ORAL_TABLET | Freq: Three times a day (TID) | ORAL | 1 refills | Status: DC | PRN

## 2019-08-29 NOTE — Telephone Encounter (Signed)
Pt is calling she needs a refill on rx Predisone in pill form sent today to [facility] [address]

## 2019-08-29 NOTE — Telephone Encounter (Signed)
ERROR

## 2019-08-29 NOTE — Telephone Encounter (Signed)
Just called Penny Hardy and she states that it is in the PA status and they should hear something from insurance company next week.  Informed patient that medication was called in to the pharamcy

## 2019-08-29 NOTE — Telephone Encounter (Signed)
I have sent them in, can you check in the status of remicade?  Thanks RV

## 2019-08-29 NOTE — Telephone Encounter (Signed)
Last office visit 08/19/2019 History of colitis  Last refill Prednisone does not see that we have filled this for her .  Called patient and it was given to patient on 08/07/19 in Liquid form. Patient would like this in pill form if possible.  Last refill Zoftran 07/25/2019 0 refills. Patient states she needs this filled because she is very nausea

## 2019-09-01 ENCOUNTER — Other Ambulatory Visit: Payer: Self-pay

## 2019-09-01 ENCOUNTER — Telehealth: Payer: Self-pay

## 2019-09-01 ENCOUNTER — Ambulatory Visit: Payer: Medicaid Other | Admitting: Anesthesiology

## 2019-09-01 ENCOUNTER — Encounter
Admission: RE | Disposition: A | Discharge status: Home / Self Care | Payer: Self-pay | Source: Home / Self Care | Attending: Gastroenterology

## 2019-09-01 ENCOUNTER — Other Ambulatory Visit: Payer: Self-pay | Admitting: Gastroenterology

## 2019-09-01 ENCOUNTER — Ambulatory Visit
Admission: RE | Admit: 2019-09-01 | Discharge: 2019-09-01 | Disposition: A | Discharge status: Home / Self Care | Payer: Medicaid Other | Attending: Gastroenterology | Admitting: Gastroenterology

## 2019-09-01 ENCOUNTER — Encounter: Payer: Self-pay | Admitting: Gastroenterology

## 2019-09-01 DIAGNOSIS — K219 Gastro-esophageal reflux disease without esophagitis: Secondary | ICD-10-CM | POA: Insufficient documentation

## 2019-09-01 DIAGNOSIS — F17210 Nicotine dependence, cigarettes, uncomplicated: Secondary | ICD-10-CM | POA: Insufficient documentation

## 2019-09-01 DIAGNOSIS — Z7982 Long term (current) use of aspirin: Secondary | ICD-10-CM | POA: Insufficient documentation

## 2019-09-01 DIAGNOSIS — Z886 Allergy status to analgesic agent status: Secondary | ICD-10-CM | POA: Insufficient documentation

## 2019-09-01 DIAGNOSIS — Z79899 Other long term (current) drug therapy: Secondary | ICD-10-CM | POA: Insufficient documentation

## 2019-09-01 DIAGNOSIS — Z888 Allergy status to other drugs, medicaments and biological substances status: Secondary | ICD-10-CM | POA: Diagnosis not present

## 2019-09-01 DIAGNOSIS — K523 Indeterminate colitis: Secondary | ICD-10-CM

## 2019-09-01 DIAGNOSIS — Z791 Long term (current) use of non-steroidal anti-inflammatories (NSAID): Secondary | ICD-10-CM | POA: Diagnosis not present

## 2019-09-01 DIAGNOSIS — K644 Residual hemorrhoidal skin tags: Secondary | ICD-10-CM | POA: Insufficient documentation

## 2019-09-01 DIAGNOSIS — Z8719 Personal history of other diseases of the digestive system: Secondary | ICD-10-CM | POA: Diagnosis not present

## 2019-09-01 DIAGNOSIS — K5010 Crohn's disease of large intestine without complications: Secondary | ICD-10-CM

## 2019-09-01 HISTORY — PX: FLEXIBLE SIGMOIDOSCOPY: SHX5431

## 2019-09-01 LAB — POCT PREGNANCY, URINE: Preg Test, Ur: NEGATIVE

## 2019-09-01 SURGERY — SIGMOIDOSCOPY, FLEXIBLE
Anesthesia: General

## 2019-09-01 MED ORDER — LIDOCAINE HCL (PF) 2 % IJ SOLN
INTRAMUSCULAR | Status: DC | PRN
  Administered 2019-09-01: 100 mg

## 2019-09-01 MED ORDER — SODIUM CHLORIDE 0.9 % IV SOLN: INTRAVENOUS | Status: DC

## 2019-09-01 MED ORDER — FENTANYL CITRATE (PF) 100 MCG/2ML IJ SOLN
INTRAMUSCULAR | Status: AC
  Filled 2019-09-01: qty 2

## 2019-09-01 MED ORDER — MIDAZOLAM HCL 2 MG/2ML IJ SOLN
INTRAMUSCULAR | Status: AC
  Filled 2019-09-01: qty 2

## 2019-09-01 MED ORDER — LIDOCAINE HCL (PF) 2 % IJ SOLN
INTRAMUSCULAR | Status: AC
  Filled 2019-09-01: qty 5

## 2019-09-01 MED ORDER — FENTANYL CITRATE (PF) 100 MCG/2ML IJ SOLN
INTRAMUSCULAR | Status: DC | PRN
  Administered 2019-09-01 (×2): 50 ug via INTRAVENOUS

## 2019-09-01 MED ORDER — PROPOFOL 500 MG/50ML IV EMUL
INTRAVENOUS | Status: DC | PRN
  Administered 2019-09-01: 50 ug/kg/min via INTRAVENOUS

## 2019-09-01 MED ORDER — PROPOFOL 10 MG/ML IV BOLUS
INTRAVENOUS | Status: DC | PRN
  Administered 2019-09-01: 30 mg via INTRAVENOUS

## 2019-09-01 MED ORDER — MIDAZOLAM HCL 5 MG/5ML IJ SOLN
INTRAMUSCULAR | Status: DC | PRN
  Administered 2019-09-01 (×2): 2 mg via INTRAVENOUS

## 2019-09-01 NOTE — Anesthesia Postprocedure Evaluation (Signed)
Anesthesia Post Note  Patient: Penny Hardy  Procedure(s) Performed: FLEXIBLE SIGMOIDOSCOPY (N/A )  Patient location during evaluation: Endoscopy Anesthesia Type: General Level of consciousness: awake and alert Pain management: pain level controlled Vital Signs Assessment: post-procedure vital signs reviewed and stable Respiratory status: spontaneous breathing, nonlabored ventilation, respiratory function stable and patient connected to nasal cannula oxygen Cardiovascular status: blood pressure returned to baseline and stable Postop Assessment: no apparent nausea or vomiting Anesthetic complications: no     Last Vitals:  Vitals:   09/01/19 1120 09/01/19 1130  BP: 107/68 105/69  Pulse: 88 90  Resp: 19 17  Temp:    SpO2: 99% 99%    Last Pain:  Vitals:   09/01/19 1100  TempSrc: Temporal  PainSc:                  [name]

## 2019-09-01 NOTE — H&P (Signed)
Cephas Darby, MD [address]  [place]  [place], [place] [address]  Main: [phone]  Fax: [phone] Pager: [phone]  Primary Care Physician:  Freddy Finner, NP Primary Gastroenterologist:  Dr. Cephas Darby  Pre-Procedure History & Physical: HPI:  HELGA ASBURY is a 39 y.o. female is here for an flexible sigmoidoscopy.   History reviewed. No pertinent past medical history.  Past Surgical History:  Procedure Laterality Date  . CESAREAN SECTION      Prior to Admission medications   Medication Sig Start Date End Date Taking? Authorizing Provider  acetaminophen (TYLENOL) 325 MG tablet Take 650 mg by mouth every 6 (six) hours as needed. 12/22/16   [provider]  mesalamine (LIALDA) 1.2 g EC tablet Take by mouth. 10/23/18 10/23/19  [provider]  mesalamine (ROWASA) 4 g enema Place rectally. 05/15/19   [provider]  ondansetron (ZOFRAN ODT) 4 MG disintegrating tablet Take 1 tablet (4 mg total) by mouth every 8 (eight) hours as needed. 07/25/19   Gregor Hams, MD  ondansetron (ZOFRAN) 4 MG tablet Take 1 tablet (4 mg total) by mouth every 8 (eight) hours as needed for nausea or vomiting. 08/29/19   Lin Landsman, MD  phentermine 30 MG capsule Take by mouth.    [provider]  predniSONE (DELTASONE) 5 MG tablet Take 3 tablets (15 mg total) by mouth daily with breakfast for 14 days. 08/29/19 09/12/19  Lin Landsman, MD    Allergies as of 08/19/2019 - Review Complete 08/19/2019  Allergen Reaction Noted  . Asa [aspirin] Hives and Nausea And Vomiting 12/17/2014  . Ibuprofen Hives and Nausea And Vomiting 10/01/2018  . Pollen extract Other (See Comments) 04/24/2013    History reviewed. No pertinent family history.  Social History   Socioeconomic History  . Marital status: Single    Spouse name: Not on file  . Number of children: Not on file  . Years of education: Not on file  . Highest education level: Not on  file  Occupational History  . Not on file  Tobacco Use  . Smoking status: Current Some Day Smoker    Types: Cigarettes  . Smokeless tobacco: Never Used  Substance and Sexual Activity  . Alcohol use: No  . Drug use: No  . Sexual activity: Not Currently  Other Topics Concern  . Not on file  Social History Narrative  . Not on file   Social Determinants of Health   Financial Resource Strain:   . Difficulty of Paying Living Expenses: Not on file  Food Insecurity:   . Worried About Charity fundraiser in the Last Year: Not on file  . Ran Out of Food in the Last Year: Not on file  Transportation Needs:   . Lack of Transportation (Medical): Not on file  . Lack of Transportation (Non-Medical): Not on file  Physical Activity:   . Days of Exercise per Week: Not on file  . Minutes of Exercise per Session: Not on file  Stress:   . Feeling of Stress : Not on file  Social Connections:   . Frequency of Communication with Friends and Family: Not on file  . Frequency of Social Gatherings with Friends and Family: Not on file  . Attends Religious Services: Not on file  . Active Member of Clubs or Organizations: Not on file  . Attends Archivist Meetings: Not on file  . Marital Status: Not on file  Intimate Partner Violence:   .  Fear of Current or Ex-Partner: Not on file  . Emotionally Abused: Not on file  . Physically Abused: Not on file  . Sexually Abused: Not on file    Review of Systems: See HPI, otherwise negative ROS  Physical Exam: BP 103/67   Pulse 93   Temp (!) 97.3 F (36.3 C) (Temporal)   Resp 17   Ht 5' 2"  (1.575 m)   Wt 110.2 kg   SpO2 95%   BMI 44.45 kg/m  General:   Alert,  pleasant and cooperative in NAD Head:  Normocephalic and atraumatic. Neck:  Supple; no masses or thyromegaly. Lungs:  Clear throughout to auscultation.    Heart:  Regular rate and rhythm. Abdomen:  Soft, nontender and nondistended. Normal bowel sounds, without guarding, and  without rebound.   Neurologic:  Alert and  oriented x4;  grossly normal neurologically.  Impression/Plan: EMELDA KOHLBECK is here for an flexible sigmoidoscopy to be performed for h/o colitis  Risks, benefits, limitations, and alternatives regarding  flexible sigmoidoscopy have been reviewed with the patient.  Questions have been answered.  All parties agreeable.   [name], MD  09/01/2019, 11:14 AM

## 2019-09-01 NOTE — Transfer of Care (Signed)
Immediate Anesthesia Transfer of Care Note  Patient: Penny Hardy  Procedure(s) Performed: FLEXIBLE SIGMOIDOSCOPY (N/A )  Patient Location: PACU  Anesthesia Type:General  Level of Consciousness: sedated  Airway & Oxygen Therapy: Patient Spontanous Breathing and Patient connected to nasal cannula oxygen  Post-op Assessment: Report given to RN and Post -op Vital signs reviewed and stable  Post vital signs: Reviewed and stable  Last Vitals:  Vitals Value Taken Time  BP 103/67 09/01/19 1109  Temp    Pulse 91 09/01/19 1110  Resp 16 09/01/19 1110  SpO2 95 % 09/01/19 1110  Vitals shown include unvalidated device data.  Last Pain:  Vitals:   09/01/19 1100  TempSrc: Temporal  PainSc:          Complications: No apparent anesthesia complications

## 2019-09-01 NOTE — Telephone Encounter (Signed)
Patient had flex sigmoid today and she states she is in sever rectal pain. Patient states she has tried taking tylenol and it has not been effective. Patient wants to know if anything stronger can be called into the pharmacy for her.

## 2019-09-01 NOTE — Op Note (Signed)
[facility] Gastroenterology Patient Name: Penny Hardy Procedure Date: 09/01/2019 10:44 AM MRN: [id number] Account #: [id number] Date of Birth: 04/07/1981 Admit Type: Outpatient Age: 39 Room: [facility] ENDO ROOM 1 Gender: Female Note Status: Finalized Procedure:             Flexible Sigmoidoscopy Providers:             Lin Landsman MD, MD Referring MD:          Neomia Dear (Referring MD) Medicines:             Monitored Anesthesia Care Complications:         No immediate complications. Estimated blood loss: None. Procedure:             Pre-Anesthesia Assessment:                        - Prior to the procedure, a History and Physical was                         performed, and patient medications and allergies were                         reviewed. The patient is competent. The risks and                         benefits of the procedure and the sedation options and                         risks were discussed with the patient. All questions                         were answered and informed consent was obtained.                         Patient identification and proposed procedure were                         verified by the physician, the nurse, the                         anesthesiologist, the anesthetist and the technician                         in the pre-procedure area in the procedure room in the                         endoscopy suite. Mental Status Examination: alert and                         oriented. Airway Examination: normal oropharyngeal                         airway and neck mobility. Respiratory Examination:                         clear to auscultation. CV Examination: normal.                         Prophylactic Antibiotics: The patient does  not require                         prophylactic antibiotics. Prior Anticoagulants: The                         patient has taken no previous anticoagulant or                         antiplatelet  agents. ASA Grade Assessment: III - A                         patient with severe systemic disease. After reviewing                         the risks and benefits, the patient was deemed in                         satisfactory condition to undergo the procedure. The                         anesthesia plan was to use monitored anesthesia care                         (MAC). Immediately prior to administration of                         medications, the patient was re-assessed for adequacy                         to receive sedatives. The heart rate, respiratory                         rate, oxygen saturations, blood pressure, adequacy of                         pulmonary ventilation, and response to care were                         monitored throughout the procedure. The physical                         status of the patient was re-assessed after the                         procedure.                        After obtaining informed consent, the scope was passed                         under direct vision. The Endoscope was introduced                         through the anus and advanced to the the descending                         colon. The flexible sigmoidoscopy was performed with  moderate difficulty due to the patient's discomfort                         during the procedure. Successful completion of the                         procedure was aided by increasing the dose of sedation                         medication. The patient tolerated the procedure fairly                         well. The quality of the bowel preparation was fair. Findings:      Skin tags were found on perianal exam.      Inflammation characterized by erosions, friability, aphthous ulcerations       and shallow ulcerations was found as patches surrounded by normal mucosa       in the rectum, in the sigmoid colon and in the descending colon. This       was mild in severity. Biopsies were taken  with a cold forceps for       histology. Impression:            - Preparation of the colon was fair.                        - Perianal skin tags found on perianal exam.                        - Crohn's disease with colonic involvement.                         Inflammation was found in the rectum, in the sigmoid                         colon and in the descending colon. This was mild in                         severity. Biopsied. Recommendation:        - Discharge patient to home (with escort).                        - Resume previous diet today.                        - Await pathology results.                        - Continue present medications. Procedure Code(s):     --- Professional ---                        [phone], Sigmoidoscopy, flexible; with biopsy, single or                         multiple Diagnosis Code(s):     --- Professional ---                        K50.10, Crohn's disease of large intestine without  complications                        K64.4, Residual hemorrhoidal skin tags CPT copyright 2019 American Medical Association. All rights reserved. The codes documented in this report are preliminary and upon coder review may  be revised to meet current compliance requirements. Dr. Ulyess Mort Lin Landsman MD, MD 09/01/2019 11:08:25 AM This report has been signed electronically. Number of Addenda: 0 Note Initiated On: 09/01/2019 10:44 AM Total Procedure Duration: 0 hours 7 minutes 22 seconds  Estimated Blood Loss:  Estimated blood loss: none.      [facility]

## 2019-09-01 NOTE — Anesthesia Preprocedure Evaluation (Addendum)
Anesthesia Evaluation  Patient identified by MRN, date of birth, ID band Patient awake    Reviewed: Allergy & Precautions, H&P , NPO status , Patient's Chart, lab work & pertinent test results, reviewed documented beta blocker date and time   History of Anesthesia Complications Negative for: history of anesthetic complications  Airway Mallampati: II  TM Distance: >3 FB Neck ROM: full    Dental  (+) Dental Advidsory Given, Teeth Intact   Pulmonary neg shortness of breath, neg COPD, neg recent URI, Current Smoker and Patient abstained from smoking.,    Pulmonary exam normal        Cardiovascular Exercise Tolerance: Good negative cardio ROS Normal cardiovascular exam     Neuro/Psych negative neurological ROS  negative psych ROS   GI/Hepatic Neg liver ROS, GERD  ,  Endo/Other  neg diabetesMorbid obesity  Renal/GU negative Renal ROS  negative genitourinary   Musculoskeletal   Abdominal   Peds  Hematology negative hematology ROS (+)   Anesthesia Other Findings History reviewed. No pertinent past medical history.   Reproductive/Obstetrics negative OB ROS                            Anesthesia Physical Anesthesia Plan  ASA: III  Anesthesia Plan: General   Post-op Pain Management:    Induction: Intravenous  PONV Risk Score and Plan: 3 and Propofol infusion and TIVA  Airway Management Planned: Natural Airway and Nasal Cannula  Additional Equipment:   Intra-op Plan:   Post-operative Plan:   Informed Consent: I have reviewed the patients History and Physical, chart, labs and discussed the procedure including the risks, benefits and alternatives for the proposed anesthesia with the patient or authorized representative who has indicated his/her understanding and acceptance.     Dental Advisory Given  Plan Discussed with: Anesthesiologist, CRNA and Surgeon  Anesthesia Plan Comments:          Anesthesia Quick Evaluation

## 2019-09-01 NOTE — Telephone Encounter (Signed)
Message patient on my chart  RV

## 2019-09-02 ENCOUNTER — Encounter: Payer: Self-pay | Admitting: *Deleted

## 2019-09-02 ENCOUNTER — Encounter: Payer: Self-pay | Admitting: Gastroenterology

## 2019-09-02 LAB — SURGICAL PATHOLOGY

## 2019-09-04 ENCOUNTER — Telehealth: Payer: Self-pay | Admitting: Gastroenterology

## 2019-09-04 DIAGNOSIS — A0472 Enterocolitis due to Clostridium difficile, not specified as recurrent: Secondary | ICD-10-CM

## 2019-09-04 LAB — GI PROFILE, STOOL, PCR
Adenovirus F 40/41: NOT DETECTED
Astrovirus: NOT DETECTED
C difficile toxin A/B: DETECTED — AB
Campylobacter: NOT DETECTED
Cryptosporidium: NOT DETECTED
Cyclospora cayetanensis: NOT DETECTED
Entamoeba histolytica: NOT DETECTED
Enteroaggregative E coli: NOT DETECTED
Enteropathogenic E coli: NOT DETECTED
Enterotoxigenic E coli: NOT DETECTED
Giardia lamblia: NOT DETECTED
Norovirus GI/GII: DETECTED — AB
Plesiomonas shigelloides: NOT DETECTED
Rotavirus A: NOT DETECTED
Salmonella: NOT DETECTED
Sapovirus: NOT DETECTED
Shiga-toxin-producing E coli: NOT DETECTED
Shigella/Enteroinvasive E coli: NOT DETECTED
Vibrio cholerae: NOT DETECTED
Vibrio: NOT DETECTED
Yersinia enterocolitica: NOT DETECTED

## 2019-09-04 MED ORDER — VANCOMYCIN HCL 125 MG PO CAPS: 125.0000 mg | ORAL_CAPSULE | Freq: Four times a day (QID) | ORAL | 0 refills | Status: AC

## 2019-09-04 NOTE — Telephone Encounter (Signed)
Called patient to discuss about the abnormal stool study results.  She is positive for C. difficile infection as well as norovirus.  These test results can explain elevated fecal calprotectin levels as well as acute colitis seen on the pathology from recent flexible sigmoidoscopy as well as from the colonoscopy in 2020.  She has been experiencing reactive arthritis, tender nodules, cold sores which can be also seen in Campylobacter jejuni infection.   Patient accessed her previous stool study results by her PCP Freddy Finner in April 2020, toxin A & B were negative, Salmonella were negative.  Recommendations: We will treat C. difficile with 2 weeks course of oral vancomycin 125 mg 4 times a day Discontinue prednisone Hold off on Remicade for now Patient expressed understanding of the plan  Cephas Darby, MD [address]  [place]  [place], [place] [address]  Main: [phone]  Fax: [phone] Pager: [phone]

## 2019-09-08 ENCOUNTER — Telehealth: Payer: Self-pay

## 2019-09-08 DIAGNOSIS — K644 Residual hemorrhoidal skin tags: Secondary | ICD-10-CM

## 2019-09-08 MED ORDER — HYDROCORTISONE ACETATE 25 MG RE SUPP: 25.0000 mg | Freq: Two times a day (BID) | RECTAL | 0 refills | Status: DC

## 2019-09-08 NOTE — Telephone Encounter (Signed)
Faxed medication to pharmacy. Informed patient by [employer]

## 2019-09-08 NOTE — Telephone Encounter (Signed)
Called patient to address her concerns.  She started taking oral vancomycin on Thursday has been tolerating it well.  After she stopped prednisone 16m, she noticed return of her joint pains and rectal bleeding.  She reports rectal discomfort (describes as someone is cutting to her rectum) and swelling in her rectum along with bright red blood per rectum on wiping and in the toilet bowl.  There was very mild inflammation in her left colon including the rectum on recent sigmoidoscopy and there was no evidence of obvious perianal/rectal fistula.  There were perianal skin tags.  Her symptoms are likely secondary to underlying anal fissure or inflamed external hemorrhoids or combination of both.  My recommendations are Continue oral vancomycin Decrease prednisone to 10 mg today, followed by 5 mg for 1 week, then stop We will try Anusol 25 mg suppository 2 times a day We will try 0.125% nitroglycerin with 5% lidocaine to treat anal fissure, precautions discussed  Patient expressed understanding of the plan  RCephas Darby MD [address] [place] [id number] Main: [phone] Fax: [phone]: [phone]

## 2019-09-08 NOTE — Telephone Encounter (Signed)
-----   Message from Lin Landsman, MD sent at 09/08/2019 12:04 PM EST ----- Regarding: Prescription Please call in prescription for 0.125% nitroglycerin with 5% lidocaine and inform patient regarding the pharmacy address Dx: Anal fissure  Patient is on my chartThanks RV

## 2019-09-08 NOTE — Telephone Encounter (Signed)
Patient is calling because she states she got a call from Dr. Marius Ditch on Thursday stating she had C Diff. Patient states she informed her to start the Vancomycin and to stop the Prednisone. Patient states she did this but on Saturday she had extreme rectal bleeding with bowel movements. Patient states blood clots are in the toilet and the toilet water is red. She states she has a lot of rectal swelling. Patient states everything she eats comes out diarrhea. Patient states that she began taking the Prednisone again Saturday night because she was in so much discomfort. Patient states she also has been having a hard time peeing because she feels like she has to pee and then only a little comes out. She states last night she woke up and there was blood in her underwear. Patient states she needs some help in what to do.

## 2019-09-09 ENCOUNTER — Telehealth: Payer: Self-pay

## 2019-09-09 ENCOUNTER — Ambulatory Visit: Payer: Self-pay | Admitting: Surgery

## 2019-09-09 ENCOUNTER — Other Ambulatory Visit: Payer: Self-pay

## 2019-09-09 ENCOUNTER — Ambulatory Visit: Payer: Medicaid Other | Admitting: Gastroenterology

## 2019-09-09 ENCOUNTER — Encounter: Payer: Self-pay | Admitting: Gastroenterology

## 2019-09-09 VITALS — BP 108/72 | HR 107 | Temp 98.0°F | Wt 246.0 lb

## 2019-09-09 DIAGNOSIS — K602 Anal fissure, unspecified: Secondary | ICD-10-CM

## 2019-09-09 DIAGNOSIS — K600 Acute anal fissure: Secondary | ICD-10-CM

## 2019-09-09 DIAGNOSIS — A0472 Enterocolitis due to Clostridium difficile, not specified as recurrent: Secondary | ICD-10-CM

## 2019-09-09 DIAGNOSIS — K625 Hemorrhage of anus and rectum: Secondary | ICD-10-CM

## 2019-09-09 DIAGNOSIS — K6289 Other specified diseases of anus and rectum: Secondary | ICD-10-CM

## 2019-09-09 MED ORDER — TRAMADOL HCL 50 MG PO TABS: 50.0000 mg | ORAL_TABLET | Freq: Four times a day (QID) | ORAL | 0 refills | Status: AC | PRN

## 2019-09-09 NOTE — Progress Notes (Signed)
f °

## 2019-09-09 NOTE — Progress Notes (Signed)
Penny Darby, MD [address]  [place]  [place], [place] [address]  Main: [phone]  Fax: [phone]    Gastroenterology Consultation  Referring Provider:     Freddy Finner, NP Primary Care Physician:  Freddy Finner, NP Primary Gastroenterologist:  Dr. Alice Reichert Reason for Consultation:  ?IBD        HPI:   Penny Hardy is a 39 y.o. female referred by Dr. Freddy Finner, NP  for consultation & management of recent history of rectal bleeding and diarrhea.  Patient went to [facility] ER on 07/25/2019 due to 2-3 episodes of bright red bleeding per rectum associated with diarrhea.  She underwent CT abdomen and pelvis with contrast which were unremarkable.  CBC, CMP, serum lipase, UA, urine pregnancy test were unremarkable.  She was discharged home on prednisone 60 mg given her history of inflammatory bowel disease.  Patient was taking 10 mg daily.  She reports that her rectal bleeding and diarrhea have improved.  She decided to change her gastroenterologist for further care of her GI condition.   Since early 2020, patient was experiencing multiple painful oral ulcers, painful swallowing as well as large joint arthralgias, low-grade fever as well as 1 month of nonbloody diarrhea, associated with abdominal pain, nausea and vomiting, fecal urgency as well as bright red blood per rectum with watery diarrhea.  She was initially seen by rheumatologist, at [facility] clinic and she was told that she may have underlying Crohn's disease or Behcet's and was started on prednisone 60 mg daily.  Her diarrhea has resolved since starting prednisone.  She also had history of painful vaginal and anal ulcers and was tested negative for HSV.  Subsequently, in 09/2018, patient was seen by Dr. Alice Reichert to evaluate for inflammatory bowel disease given her history of GI symptoms.  She had elevated CRP 18, ESR 61.  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  Apparently, patient was managed on prednisone by her rheumatologist Dr. Annalee Genta.  After endoscopy evaluation by Dr. Alice Reichert, she was given diagnosis of Crohn's disease and was recommended to start Entocort in early April 2020.  She was taking budesonide 9 mg daily and prednisone tapered down to 10 mg.  Her diarrhea recurred on Entocort, therefore she was started on mesalamine enema as well as Asacol 1.6 g 3 times a day.  Since she could not afford Asacol, it was switched to Lialda 1.2 g 4 times a day.  Patient could not tolerate mesalamine enema.  Patient reports that her GI symptoms improved on Lialda until recently when she went to [facility] ER on 1/15 due to rectal bleeding, right upper quadrant pain.  She was told by the ER physician to stop mesalamine, started on prednisone 60 mg daily and was referred to [facility] as patient wished to change her gastroenterologist.  Today she is concerned about recurrence of painful oral ulcers, ongoing loose watery bowel movements, scant blood mixed with the stool and not feeling well.  Patient is asking about Remicade as it was mentioned to her by Dr. Owens Shark, ER physician during recent ER visit.  She says mesalamine does not work anymore for her.  Patient gained about 13 pounds since initiation of prednisone in 09/2018 to date. She does smoke cigarettes regularly She denies alcohol use Patient became emotional during her appointment today due to ongoing symptoms and not able to find a good treatment yet  Follow-up televisit 08/19/2019 She  continues to take prednisone, decreased to 5 mg and noticed flareup of her symptoms including rectal bleeding, diarrhea and large joint arthralgias.  She says overall, she does not feel good.  I have discussed with her that I have reviewed her upper endoscopy and colonoscopy reports including pathology results which are not confirmatory for inflammatory bowel disease.  She may have early onset/de novo  left-sided ulcerative colitis.  She does have internal hemorrhoids which may also be contributing to the rectal bleeding.  Patient is stressed about not feeling well and with the diagnosis not clear yet She continues to take mesalamine 4 pills daily  Follow-up visit 09/09/19 Penny Hardy called my office today and requested an urgent visit due to severe anal pain.  She recently underwent flexible sigmoidoscopy which revealed mild inflammation in the rectosigmoid area, pathology revealed active colitis only with no evidence of chronicity.  I perform her stool studies which returned positive for C. difficile infection and I started her on oral vancomycin 125 mg 4 times a day.  However, within last 5 days, she reports worsening of pain in her rectum, as if something is cutting through her anal canal every time she has a bowel movement associated with rectal bleeding.  She is unable to sit or stand and go to work.  She has been using hydrocortisone suppository, applying witch hazel and using Tucks pads.  She has also started sitz bath is not helping.  Given her history, I prescribed 0.125% nitroglycerin with 5% lidocaine to treat anal fissure.  Apparently, she has not been using the nitroglycerin currently.  Patient has been tearful in the office today due to ongoing pain. She has been taking mesalamine, reports ongoing diarrhea.  She also reports mesalamine causes her diarrhea as well  NSAIDs: None  Antiplts/Anticoagulants/Anti thrombotics: None  GI Procedures:  Flexible sigmoidoscopy 09/01/2019 - Preparation of the colon was fair. - Perianal skin tags found on perianal exam. - Crohn's disease with colonic involvement. Inflammation was found in the rectum, in the sigmoid colon and in the descending colon. This was mild in severity. Biopsied. DIAGNOSIS:  A. COLON, RANDOM LEFT; COLD BIOPSY:  - COLONIC MUCOSA WITH FOCAL AND MILD EDEMA.  - NEGATIVE FOR MICROSCOPIC COLITIS, FEATURES OF CHRONICITY, DYSPLASIA,   AND MALIGNANCY.   B. 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  - NEGATIVE FOR FEATURES OF CHRONICITY, DYSPLASIA, AND MALIGNANCY  EGD 09/23/2018 The examined esophagus was normal Patchy minimal inflammation characterized by erythema was found in the gastric body and in the gastric antrum, biopsies were performed.  Normal cardia and gastric fundus on retroflexion.  The examined duodenum was normal.  Biopsies were performed to evaluate for celiac disease. Pathology Duodenal biopsy revealed duodenal mucosa with focal gastric metaplasia, no other abnormalities.  Stomach biopsy: Mild to moderate chronic gastritis, negative for Helicobacter pylori  Colonoscopy 09/23/2018 The perianal and digital rectal exam were normal.  A small fistula was found in the distal rectum.  Nonbleeding internal hemorrhoids on retroflexion.  An area of moderately congested mucosa was found in the rectum, sigmoid colon and the descending colon and in the ascending colon.  Biopsies were taken for histology.  Terminal ileum was normal.  Biopsies were taken.  The transverse colon and cecum appeared normal.  Biopsies were taken.  Few small mouth diverticula were found in the sigmoid colon Pathology terminal ileum: Ileal mucosa with no diagnostic abnormalities, no evidence of  inflammatory bowel disease.  Cecum: No evidence of active inflammatory bowel disease, colonic mucosa with no diagnostic abnormalities, negative for dysplasia Colon biopsy, ascending colon: Mucosa with architectural distortion compatible with prior mucosal damage.  No evidence of active idiopathic inflammatory bowel disease.  Colon biopsy, transverse colon: Mucosa with no diagnostic abnormalities, no evidence of active inflammatory bowel disease.  Colon biopsy descending colon: Focal active colitis, negative for dysplasia or malignancy.  Colon biopsy sigmoid colon and  rectum: Focal active cryptitis, negative for dysplasia or malignancy    History reviewed. No pertinent past medical history.  Past Surgical History:  Procedure Laterality Date  . CESAREAN SECTION    . FLEXIBLE SIGMOIDOSCOPY N/A 09/01/2019   Procedure: FLEXIBLE SIGMOIDOSCOPY;  Surgeon: Lin Landsman, MD;  Location: [facility] ENDOSCOPY;  Service: Gastroenterology;  Laterality: N/A;    Current Outpatient Medications:  .  acetaminophen (TYLENOL) 325 MG tablet, Take 650 mg by mouth every 6 (six) hours as needed., Disp: , Rfl:  .  hydrocortisone (ANUSOL-HC) 25 MG suppository, Place 1 suppository (25 mg total) rectally 2 (two) times daily., Disp: 12 suppository, Rfl: 0 .  mesalamine (LIALDA) 1.2 g EC tablet, Take by mouth., Disp: , Rfl:  .  mesalamine (ROWASA) 4 g enema, Place rectally., Disp: , Rfl:  .  ondansetron (ZOFRAN) 4 MG tablet, Take 1 tablet (4 mg total) by mouth every 8 (eight) hours as needed for nausea or vomiting., Disp: 30 tablet, Rfl: 1 .  predniSONE (DELTASONE) 5 MG tablet, Take 3 tablets (15 mg total) by mouth daily with breakfast for 14 days., Disp: 42 tablet, Rfl: 0 .  vancomycin (VANCOCIN) 125 MG capsule, Take 1 capsule (125 mg total) by mouth 4 (four) times daily for 14 days., Disp: 56 capsule, Rfl: 0 .  phentermine 30 MG capsule, Take by mouth., Disp: , Rfl:  .  traMADol (ULTRAM) 50 MG tablet, Take 1 tablet (50 mg total) by mouth every 6 (six) hours as needed for up to 5 days., Disp: 20 tablet, Rfl: 0   History reviewed. No pertinent family history.   Social History   Tobacco Use  . Smoking status: Current Some Day Smoker    Types: Cigarettes  . Smokeless tobacco: Never Used  Substance Use Topics  . Alcohol use: No  . Drug use: No    Allergies as of 09/09/2019 - Review Complete 09/09/2019  Allergen Reaction Noted  . Asa [aspirin] Hives and Nausea And Vomiting 12/17/2014  . Ibuprofen Hives and Nausea And Vomiting 10/01/2018  . Pollen extract Other (See  Comments) 04/24/2013    Review of Systems:    All systems reviewed and negative except where noted in HPI.   Physical Exam:  BP 108/72 (BP Location: Left Arm, Patient Position: Sitting, Cuff Size: Large)   Pulse (!) 107   Temp 98 F (36.7 C) (Oral)   Wt 246 lb (111.6 kg)   BMI 44.99 kg/m  No LMP recorded. (Menstrual status: IUD).  General:   Alert,  Well-developed, well-nourished, pleasant and cooperative in moderate distress due to anal pain Head:  Normocephalic and atraumatic. Eyes:  Sclera clear, no icterus.   Conjunctiva pink. Ears:  Normal auditory acuity. Nose:  No deformity, discharge, or lesions. Mouth:  No deformity or lesions,oropharynx pink & moist. Neck:  Supple; no masses or thyromegaly. Lungs:  Respirations even and unlabored.  Clear throughout to auscultation.   No wheezes, crackles, or rhonchi. No acute distress. Heart:  Regular rate and rhythm; no murmurs, clicks, rubs, or  gallops. Abdomen:  Normal bowel sounds. Soft, morbidly obese, non-tender and non-distended without masses,  No guarding or rebound tenderness.   Rectal: Perianal skin tags, normal perianal skin without evidence of any cutaneous fistulas.  The posterior skin tag is inflamed and tender, digital rectal exam revealed sharp tenderness in the posterior wall of the anal canal.  There is no tenderness in the anterior part of the anal canal.  No evidence of bleeding on rectal exam today.  Patient did not allow for anoscopy due to intense pain. Msk:  Symmetrical without gross deformities. Good, equal movement & strength bilaterally. Pulses:  Normal pulses noted. Extremities:  No clubbing or edema.  No cyanosis. Neurologic:  Alert and oriented x3;  grossly normal neurologically. Skin:  Intact without significant lesions or rashes. No jaundice. Psych:  Alert and cooperative. Normal mood and affect.  Imaging Studies: Reviewed  Assessment and Plan:   KATHEREN JIMMERSON is a 39 y.o. female with BMI 45, chronic  tobacco use is seen for follow-up of approximately 1 year history of oral ulcers, diarrhea with rectal bleeding, distal rectal fistula,?  Inflammatory bowel disease.  I have personally reviewed the upper endoscopy and colonoscopy reports including pathology results.  There is no evidence of upper GI Crohn's.  Based on the colonoscopy report, there was evidence of segmental congestion in the left colon and ascending colon, however histology revealed active colitis/cryptitis only with no evidence of background chronicity.  I am not convinced if patient has inflammatory bowel disease based on the histology.  Therefore, I performed repeat flexible sigmoidoscopy which revealed mild inflammation in the rectosigmoid colon and biopsies did not reveal any chronicity.  There was evidence of mild active colitis only.  Her fecal calprotectin levels were significantly elevated out of proportion to the endoscopy findings.  Therefore, I performed stool studies which returned positive for norovirus as well as C. difficile toxin A and B.  Patient is here for follow-up of severe rectal pain  Severe rectal pain with inflamed perianal skin tags Exam consistent with posterior anal fissure Instructed patient how to apply nitroglycerin ointment A pea-sized nitroglycerin ointment on hydrocortisone suppository was inserted per rectum in clinic today Reiterated to continue the same 3-4 times daily, continue sitz bath's Due to intense pain, prescribed her 5 days of tramadol only Work excuse note given for 1 week If patient does not notice any improvement with medical management, will refer her to surgery for Botox injection/surgical repair of the fissure  C. difficile colitis Continue oral vancomycin 125 mg 4 times daily for 2 weeks Elevated fecal calprotectin levels are most likely secondary to infection Imodium as needed for diarrhea  Colitis, there is no evidence of inflammatory bowel disease based on colonoscopy as well as  flexible sigmoidoscopy Discontinue mesalamine We will do quick prednisone taper  Follow up in 2 to 3 weeks   Penny Darby, MD

## 2019-09-09 NOTE — Telephone Encounter (Signed)
Patient is calling because she states she tried to do the suppository and the first time it kept coming out and then she tried do it again and she states it felt like it went through another hole. Patient states that she is so swollen and having a lot of rectal pain. Patient is also doing the cream every 20-30 minutes with no relief. Patient states that she is having rectal bleeding. Patient states that she is going to the emergency room because something is not right.

## 2019-09-15 ENCOUNTER — Telehealth: Payer: Self-pay

## 2019-09-15 NOTE — Telephone Encounter (Signed)
Patient insurance denied patient remicade per Sharrie Rothman. She states that insurance wants her to try Humira first.

## 2019-09-29 ENCOUNTER — Telehealth: Payer: Self-pay | Admitting: Gastroenterology

## 2019-09-29 ENCOUNTER — Ambulatory Visit: Payer: Medicaid Other | Admitting: Gastroenterology

## 2019-09-29 DIAGNOSIS — A0472 Enterocolitis due to Clostridium difficile, not specified as recurrent: Secondary | ICD-10-CM

## 2019-09-29 NOTE — Telephone Encounter (Signed)
Please advised Patient rescheduled for 11/25/2019

## 2019-09-29 NOTE — Telephone Encounter (Signed)
Yes, ordered C. Difficile test  RV

## 2019-09-29 NOTE — Telephone Encounter (Signed)
Order the labs per Dr. Marius Ditch orders. Informed patient of this information

## 2019-09-29 NOTE — Telephone Encounter (Signed)
Pt  Had to cancel her apt for today she has a very sore throat  And wanted to see if she could pick up a stool sample kit to make sure the bacteria is gone please call pt

## 2019-09-29 NOTE — Addendum Note (Signed)
Addended by: Ulyess Blossom L on: 09/29/2019 01:34 PM   Modules accepted: Orders

## 2019-10-04 ENCOUNTER — Encounter: Payer: Self-pay | Admitting: Gastroenterology

## 2019-11-25 ENCOUNTER — Ambulatory Visit: Payer: Medicaid Other | Admitting: Gastroenterology

## 2019-12-24 ENCOUNTER — Ambulatory Visit: Payer: Medicaid Other | Admitting: Gastroenterology

## 2019-12-24 ENCOUNTER — Telehealth: Payer: Self-pay | Admitting: Gastroenterology

## 2019-12-24 NOTE — Telephone Encounter (Signed)
Patient called and wanted to know if she could have video visit 15 min before her appt time, her daughter is having an oral procedure this morning..The CMA(Ashley) informed me that the patient needed to seen(patient has cancelled twice /09-29-19 and 11-25-19).. Management (Jody) stated that the office will make an exception this last time and she wouldn't be charged a no show fee. Patient was concerned that she would get charged. Patient wanted to reschedule, since she couldn't have the video.

## 2020-03-23 ENCOUNTER — Encounter: Payer: Self-pay | Admitting: Gastroenterology

## 2020-03-23 ENCOUNTER — Ambulatory Visit: Payer: Medicaid Other | Admitting: Gastroenterology

## 2020-10-25 ENCOUNTER — Other Ambulatory Visit: Payer: Self-pay

## 2020-10-25 ENCOUNTER — Encounter: Payer: Self-pay | Admitting: Gastroenterology

## 2020-10-25 ENCOUNTER — Ambulatory Visit (INDEPENDENT_AMBULATORY_CARE_PROVIDER_SITE_OTHER): Payer: Medicaid Other | Admitting: Gastroenterology

## 2020-10-25 VITALS — BP 108/63 | HR 98 | Temp 98.3°F | Ht 62.0 in | Wt 253.4 lb

## 2020-10-25 DIAGNOSIS — Z8619 Personal history of other infectious and parasitic diseases: Secondary | ICD-10-CM | POA: Diagnosis not present

## 2020-10-25 DIAGNOSIS — K529 Noninfective gastroenteritis and colitis, unspecified: Secondary | ICD-10-CM | POA: Diagnosis not present

## 2020-10-25 DIAGNOSIS — K523 Indeterminate colitis: Secondary | ICD-10-CM | POA: Diagnosis not present

## 2020-10-25 NOTE — Progress Notes (Signed)
Cephas Darby, MD [address]  [place]  [place], [place] [address]  Main: [phone]  Fax: [phone]    Gastroenterology Consultation  Referring Provider:     Freddy Finner, NP Primary Care Physician:  Freddy Finner, NP Primary Gastroenterologist:  Dr. Alice Reichert Reason for Consultation: History of C. difficile infection, chronic diarrhea        HPI:   MONNIE GUDGEL is a 40 y.o. female referred by Dr. Freddy Finner, NP  for consultation & management of recent history of rectal bleeding and diarrhea.  Patient went to [facility] ER on 07/25/2019 due to 2-3 episodes of bright red bleeding per rectum associated with diarrhea.  She underwent CT abdomen and pelvis with contrast which were unremarkable.  CBC, CMP, serum lipase, UA, urine pregnancy test were unremarkable.  She was discharged home on prednisone 60 mg given her history of inflammatory bowel disease.  Patient was taking 10 mg daily.  She reports that her rectal bleeding and diarrhea have improved.  She decided to change her gastroenterologist for further care of her GI condition.   Since early 2020, patient was experiencing multiple painful oral ulcers, painful swallowing as well as large joint arthralgias, low-grade fever as well as 1 month of nonbloody diarrhea, associated with abdominal pain, nausea and vomiting, fecal urgency as well as bright red blood per rectum with watery diarrhea.  She was initially seen by rheumatologist, at [facility] clinic and she was told that she may have underlying Crohn's disease or Behcet's and was started on prednisone 60 mg daily.  Her diarrhea has resolved since starting prednisone.  She also had history of painful vaginal and anal ulcers and was tested negative for HSV.  Subsequently, in 09/2018, patient was seen by Dr. Alice Reichert to evaluate for inflammatory bowel disease given her history of GI symptoms.  She had elevated CRP 18, ESR 61.  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  Apparently, patient was managed on prednisone by her rheumatologist Dr. Annalee Genta.  After endoscopy evaluation by Dr. Alice Reichert, she was given diagnosis of Crohn's disease and was recommended to start Entocort in early April 2020.  She was taking budesonide 9 mg daily and prednisone tapered down to 10 mg.  Her diarrhea recurred on Entocort, therefore she was started on mesalamine enema as well as Asacol 1.6 g 3 times a day.  Since she could not afford Asacol, it was switched to Lialda 1.2 g 4 times a day.  Patient could not tolerate mesalamine enema.  Patient reports that her GI symptoms improved on Lialda until recently when she went to [facility] ER on 1/15 due to rectal bleeding, right upper quadrant pain.  She was told by the ER physician to stop mesalamine, started on prednisone 60 mg daily and was referred to [facility] as patient wished to change her gastroenterologist.  Today she is concerned about recurrence of painful oral ulcers, ongoing loose watery bowel movements, scant blood mixed with the stool and not feeling well.  Patient is asking about Remicade as it was mentioned to her by Dr. Owens Shark, ER physician during recent ER visit.  She says mesalamine does not work anymore for her.  Patient gained about 13 pounds since initiation of prednisone in 09/2018 to date. She does smoke cigarettes regularly She denies alcohol use Patient became emotional during her appointment today due to ongoing symptoms and not able to find a good treatment yet  Follow-up televisit 08/19/2019 She continues to take prednisone, decreased to 5 mg and noticed flareup of her symptoms including rectal bleeding, diarrhea and large joint arthralgias.  She says overall, she does not feel good.  I have discussed with her that I have reviewed her upper endoscopy and colonoscopy reports including pathology results which are not confirmatory for inflammatory bowel  disease.  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  She does have internal hemorrhoids which may also be contributing to the rectal bleeding.  Patient is stressed about not feeling well and with the diagnosis not clear yet She continues to take mesalamine 4 pills daily  Follow-up visit 09/09/19 Yaslene called my office today and requested an urgent visit due to severe anal pain.  She recently underwent flexible sigmoidoscopy which revealed mild inflammation in the rectosigmoid area, pathology revealed active colitis only with no evidence of chronicity.  I perform her stool studies which returned positive for C. difficile infection and I started her on oral vancomycin 125 mg 4 times a day.  However, within last 5 days, she reports worsening of pain in her rectum, as if something is cutting through her anal canal every time she has a bowel movement associated with rectal bleeding.  She is unable to sit or stand and go to work.  She has been using hydrocortisone suppository, applying witch hazel and using Tucks pads.  She has also started sitz bath is not helping.  Given her history, I prescribed 0.125% nitroglycerin with 5% lidocaine to treat anal fissure.  Apparently, she has not been using the nitroglycerin currently.  Patient has been tearful in the office today due to ongoing pain. She has been taking mesalamine, reports ongoing diarrhea.  She also reports mesalamine causes her diarrhea as well  Follow-up visit 10/25/2020 Patient has not seen me for 1 year due to COVID-19 infection.  Patient is here for follow-up of chronic diarrhea.  She reports that her rectal pain and rectal bleeding have resolved since treatment of C. difficile infection.  However, she continues to have postprandial urgency, soft to watery bowel movements associated with generalized abdominal cramps.  She is also concerned about recurrent episodes of boils appearing on her anterior abdominal wall, underarms which  are painful, pustular small abscesses which eventually rupture.  She also has intermittent aphthous ulcers in her mouth.  During these episodes, she does report myalgias.  She denies any weight loss.  NSAIDs: None  Antiplts/Anticoagulants/Anti thrombotics: None  GI Procedures:  Flexible sigmoidoscopy 09/01/2019 - Preparation of the colon was fair. - Perianal skin tags found on perianal exam. - Crohn's disease with colonic involvement. Inflammation was found in the rectum, in the sigmoid colon and in the descending colon. This was mild in severity. Biopsied. DIAGNOSIS:  A. COLON, RANDOM LEFT; COLD BIOPSY:  - COLONIC MUCOSA WITH FOCAL AND MILD EDEMA.  - NEGATIVE FOR MICROSCOPIC COLITIS, FEATURES OF CHRONICITY, DYSPLASIA,  AND MALIGNANCY.   B. 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  - NEGATIVE FOR FEATURES OF CHRONICITY, DYSPLASIA, AND MALIGNANCY  EGD 09/23/2018 The examined esophagus was normal Patchy minimal inflammation characterized by erythema was found in the gastric body and in the gastric antrum, biopsies were performed.  Normal cardia and gastric fundus on retroflexion.  The examined duodenum was normal.  Biopsies were performed to evaluate for celiac disease. Pathology Duodenal biopsy revealed duodenal mucosa with focal gastric metaplasia,  no other abnormalities.  Stomach biopsy: Mild to moderate chronic gastritis, negative for Helicobacter pylori  Colonoscopy 09/23/2018 The perianal and digital rectal exam were normal.  A small fistula was found in the distal rectum.  Nonbleeding internal hemorrhoids on retroflexion.  An area of moderately congested mucosa was found in the rectum, sigmoid colon and the descending colon and in the ascending colon.  Biopsies were taken for histology.  Terminal ileum was normal.  Biopsies were taken.  The transverse colon and cecum appeared normal.   Biopsies were taken.  Few small mouth diverticula were found in the sigmoid colon Pathology terminal ileum: Ileal mucosa with no diagnostic abnormalities, no evidence of inflammatory bowel disease.  Cecum: No evidence of active inflammatory bowel disease, colonic mucosa with no diagnostic abnormalities, negative for dysplasia Colon biopsy, ascending colon: Mucosa with architectural distortion compatible with prior mucosal damage.  No evidence of active idiopathic inflammatory bowel disease.  Colon biopsy, transverse colon: Mucosa with no diagnostic abnormalities, no evidence of active inflammatory bowel disease.  Colon biopsy descending colon: Focal active colitis, negative for dysplasia or malignancy.  Colon biopsy sigmoid colon and rectum: Focal active cryptitis, negative for dysplasia or malignancy    History reviewed. No pertinent past medical history.  Past Surgical History:  Procedure Laterality Date  . CESAREAN SECTION    . FLEXIBLE SIGMOIDOSCOPY N/A 09/01/2019   Procedure: FLEXIBLE SIGMOIDOSCOPY;  Surgeon: Lin Landsman, MD;  Location: [facility] ENDOSCOPY;  Service: Gastroenterology;  Laterality: N/A;    Current Outpatient Medications:  .  BAC 50-325-40 MG tablet, TAKE 1-2 TABLETS BY MOUTH EVERY 4 HOURS AS NEEDED FOR MIGRAINE, DO NOT EXCEED 6 TABLETS IN 24 HOURS., Disp: , Rfl:    History reviewed. No pertinent family history.   Social History   Tobacco Use  . Smoking status: Current Some Day Smoker    Types: Cigarettes  . Smokeless tobacco: Never Used  Vaping Use  . Vaping Use: Never used  Substance Use Topics  . Alcohol use: No  . Drug use: No    Allergies as of 10/25/2020 - Review Complete 10/25/2020  Allergen Reaction Noted  . Asa [aspirin] Hives and Nausea And Vomiting 12/17/2014  . Ibuprofen Hives and Nausea And Vomiting 10/01/2018  . Pollen extract Other (See Comments) 04/24/2013    Review of Systems:    All systems reviewed and negative except where noted in  HPI.   Physical Exam:  BP 108/63 (BP Location: Left Arm, Patient Position: Sitting, Cuff Size: Large)   Pulse 98   Temp 98.3 F (36.8 C) (Oral)   Ht 5' 2"  (1.575 m)   Wt 253 lb 6 oz (114.9 kg)   BMI 46.34 kg/m  No LMP recorded. (Menstrual status: IUD).  General:   Alert,  Well-developed, well-nourished, pleasant and cooperative in moderate distress due to anal pain Head:  Normocephalic and atraumatic. Eyes:  Sclera clear, no icterus.   Conjunctiva pink. Ears:  Normal auditory acuity. Nose:  No deformity, discharge, or lesions. Mouth:  No deformity or lesions,oropharynx pink & moist. Neck:  Supple; no masses or thyromegaly. Lungs:  Respirations even and unlabored.  Clear throughout to auscultation.   No wheezes, crackles, or rhonchi. No acute distress. Heart:  Regular rate and rhythm; no murmurs, clicks, rubs, or gallops. Abdomen:  Normal bowel sounds. Soft, morbidly obese, non-tender and non-distended without masses,  No guarding or rebound tenderness.   Rectal: Not performed Msk:  Symmetrical without gross deformities. Good, equal movement & strength bilaterally. Pulses:  Normal pulses noted. Extremities:  No clubbing or edema.  No cyanosis. Neurologic:  Alert and oriented x3;  grossly normal neurologically. Skin:  Intact without significant lesions or rashes. No jaundice. Psych:  Alert and cooperative. Normal mood and affect.  Imaging Studies: Reviewed  Assessment and Plan:   DESHA BITNER is a 40 y.o. female with BMI 45, chronic tobacco use is seen for follow-up of approximately 1 year history of oral ulcers, diarrhea with rectal bleeding, distal rectal fistula,?  Inflammatory bowel disease.  I have personally reviewed the upper endoscopy and colonoscopy reports including pathology results.  There is no evidence of upper GI Crohn's.  Based on the colonoscopy report, there was evidence of segmental congestion in the left colon and ascending colon, however histology revealed  active colitis/cryptitis only with no evidence of background chronicity.  I am not convinced if patient has inflammatory bowel disease based on the histology.  Therefore, I performed repeat flexible sigmoidoscopy which revealed mild inflammation in the rectosigmoid colon and biopsies did not reveal any chronicity.  There was evidence of mild active colitis only.  Her fecal calprotectin levels were significantly elevated out of proportion to the endoscopy findings.  Therefore, I performed stool studies which returned positive for norovirus as well as C. difficile toxin A and B.  She was treated for C. difficile infection with 10 days course of oral vancomycin in 2/21.  Although her rectal bleeding and pain has resolved, she continues to have postprandial urgency with abdominal cramps as well as recurrent folliculitis involving underarms.  Given her chronicity of symptoms, I recommend repeat stool studies to rule out infection including C. difficile, recheck fecal calprotectin levels as well as video capsule endoscopy to evaluate for any small bowel lesions.  If there is no evidence of infection with elevated fecal calprotectin levels and/or small bowel lesions, and with presence of extraintestinal manifestations, I would be inclined to treat as probable Crohn's disease.  Her previous colonoscopy as well as sigmoidoscopy did not reveal evidence of chronicity on histology.  Therefore, I was reluctant to start her on long-term medication.  I have tapered her off prednisone as well as discontinued mesalamine. Patient was also treated for anal fissure in the past  Indeterminate colitis, history of norovirus and C. difficile infection, elevated fecal calprotectin levels, 781 S/p treatment with oral vancomycin GI profile PCR, fecal calprotectin levels Video capsule endoscopy   Follow up in 2 to 3 months  Cephas Darby, MD

## 2020-10-26 ENCOUNTER — Encounter: Payer: Self-pay | Admitting: *Deleted

## 2020-11-11 ENCOUNTER — Encounter: Payer: Self-pay | Admitting: Gastroenterology

## 2020-11-11 ENCOUNTER — Other Ambulatory Visit: Payer: Self-pay

## 2020-11-11 ENCOUNTER — Telehealth: Payer: Self-pay

## 2020-11-11 LAB — GI PROFILE, STOOL, PCR

## 2020-11-11 LAB — CALPROTECTIN, FECAL: Calprotectin, Fecal: 1438 ug/g — ABNORMAL HIGH (ref 0–120)

## 2020-11-11 MED ORDER — VANCOMYCIN HCL 125 MG PO CAPS: 125.0000 mg | ORAL_CAPSULE | Freq: Four times a day (QID) | ORAL | 0 refills | Status: AC

## 2020-11-11 MED ORDER — VANCOMYCIN HCL 125 MG PO CAPS: 125.0000 mg | ORAL_CAPSULE | Freq: Four times a day (QID) | ORAL | 0 refills | Status: DC

## 2020-11-11 MED ORDER — VANCOMYCIN HCL 125 MG PO CAPS: 125.0000 mg | ORAL_CAPSULE | Freq: Two times a day (BID) | ORAL | 0 refills | Status: AC

## 2020-11-11 NOTE — Telephone Encounter (Signed)
Patient verbalized understanding of results. Called in medication to the pharmacy. Patient is wondering about the fecal calprotectin why the results are so high

## 2020-11-11 NOTE — Telephone Encounter (Signed)
-----   Message from Lin Landsman, MD sent at 11/11/2020  8:47 AM EDT ----- Stool studies came back positive for C. difficile infection.  Recommend oral vancomycin 125 mg p.o. 4 times daily for 10 days followed by two times a day for 1 month  Please send in prescription accordingly  Rohini Vanga

## 2020-11-22 ENCOUNTER — Other Ambulatory Visit: Payer: Self-pay

## 2020-11-22 DIAGNOSIS — K5000 Crohn's disease of small intestine without complications: Secondary | ICD-10-CM

## 2020-11-22 DIAGNOSIS — R195 Other fecal abnormalities: Secondary | ICD-10-CM

## 2020-11-30 ENCOUNTER — Telehealth: Payer: Self-pay

## 2020-11-30 NOTE — Telephone Encounter (Signed)
You will need to do a peer to peer for patient capsule study to get approved laid papers on your desk

## 2020-12-01 ENCOUNTER — Encounter: Payer: Self-pay | Admitting: Gastroenterology

## 2020-12-01 NOTE — Telephone Encounter (Signed)
I just did the letter

## 2020-12-16 ENCOUNTER — Telehealth: Payer: Self-pay

## 2020-12-16 NOTE — Telephone Encounter (Signed)
Medicaid denied the capsule study for the second time. Please advise what you recommend

## 2020-12-16 NOTE — Telephone Encounter (Signed)
Looks like she has an appointment with me in July.  We can hold off on the video capsule endoscopy for now.  If patient is not experiencing diarrhea at this time, we can wait until her follow-up appointment.  Otherwise, I will discuss with her regarding further steps next week  RV

## 2020-12-16 NOTE — Telephone Encounter (Signed)
Tried to call patient but mailbox is full

## 2020-12-16 NOTE — Telephone Encounter (Signed)
Ginger said there is no peer to peer for this insurance

## 2020-12-16 NOTE — Telephone Encounter (Signed)
Called ENDO and talked to Penny Hardy and informed her to cancel the procedure. She states she will get it cancel

## 2020-12-16 NOTE — Telephone Encounter (Signed)
Informed patient that capsule study will be cancel due to insurance not covering it. Patient will come to follow up appointment on July 13. Will call ENDO to cancel the procedure

## 2020-12-22 ENCOUNTER — Encounter: Admission: RE | Payer: Self-pay | Source: Home / Self Care

## 2020-12-22 ENCOUNTER — Ambulatory Visit: Admission: RE | Admit: 2020-12-22 | Payer: Medicaid Other | Source: Home / Self Care | Admitting: Gastroenterology

## 2020-12-22 SURGERY — IMAGING PROCEDURE, GI TRACT, INTRALUMINAL, VIA CAPSULE

## 2021-01-19 ENCOUNTER — Telehealth (INDEPENDENT_AMBULATORY_CARE_PROVIDER_SITE_OTHER): Payer: Medicaid Other | Admitting: Gastroenterology

## 2021-01-19 ENCOUNTER — Other Ambulatory Visit: Payer: Self-pay

## 2021-01-19 ENCOUNTER — Encounter: Payer: Self-pay | Admitting: Gastroenterology

## 2021-01-19 DIAGNOSIS — R195 Other fecal abnormalities: Secondary | ICD-10-CM

## 2021-01-19 DIAGNOSIS — Z8619 Personal history of other infectious and parasitic diseases: Secondary | ICD-10-CM

## 2021-01-19 NOTE — Progress Notes (Signed)
[name], MD [address]  [name]  [place], [place] [address]  Main: [phone]  Fax: [phone]    Gastroenterology Consultation Video Visit  Referring Provider:     Freddy Finner, NP Primary Care Physician:  Freddy Finner, NP Primary Gastroenterologist:  Dr. Cephas Darby Reason for Consultation:     Recurrent C. difficile diarrhea        HPI:   Penny Hardy is a 40 y.o. female referred by Dr. Freddy Finner, NP  for consultation & management of recurrent C. difficile diarrhea  Virtual Visit Video Note  I connected with Penny Hardy on 01/20/21 at  2:30 PM EDT by video and verified that I am speaking with the correct person using two identifiers.   I discussed the limitations, risks, security and privacy concerns of performing an evaluation and management service by video and the availability of in person appointments. I also discussed with the patient that there may be a patient responsible charge related to this service. The patient expressed understanding and agreed to proceed.  Location of the Patient: Home  Location of the provider: Home office  Persons participating in the visit: Patient and provider only   History of Present Illness: [name] is a 40 year old female with first recurrence of C. difficile infection s/p 10 days course of oral vancomycin in 11/2020.  Patient reports that her diarrhea has resolved with treatment.  She is no longer experiencing extraintestinal symptoms.  Patient does report that her stools have bad odor and would like to know if the C. difficile has recurred.  She does notice recurrence of painful bumps in her underarms.  I recommended patient undergo video capsule endoscopy, however her insurance denied it  NSAIDs: None   Antiplts/Anticoagulants/Anti thrombotics: None   GI Procedures: Flexible sigmoidoscopy 09/01/2019 - Preparation of the colon was fair. - Perianal skin tags found on perianal exam. -  Crohn's disease with colonic involvement. Inflammation was found in the rectum, in the sigmoid colon and in the descending colon. This was mild in severity. Biopsied. DIAGNOSIS:  A.  COLON, RANDOM LEFT; COLD BIOPSY:  - COLONIC MUCOSA WITH FOCAL AND MILD EDEMA.  - NEGATIVE FOR MICROSCOPIC COLITIS, FEATURES OF CHRONICITY, DYSPLASIA,  AND MALIGNANCY.   B.  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  - NEGATIVE FOR FEATURES OF CHRONICITY, DYSPLASIA, AND MALIGNANCY   EGD 09/23/2018 The examined esophagus was normal Patchy minimal inflammation characterized by erythema was found in the gastric body and in the gastric antrum, biopsies were performed.  Normal cardia and gastric fundus on retroflexion.  The examined duodenum was normal.  Biopsies were performed to evaluate for celiac disease. Pathology Duodenal biopsy revealed duodenal mucosa with focal gastric metaplasia, no other abnormalities.  Stomach biopsy: Mild to moderate chronic gastritis, negative for Helicobacter pylori   Colonoscopy 09/23/2018 The perianal and digital rectal exam were normal.  A small fistula was found in the distal rectum.  Nonbleeding internal hemorrhoids on retroflexion.  An area of moderately congested mucosa was found in the rectum, sigmoid colon and the descending colon and in the ascending colon.  Biopsies were taken for histology.  Terminal ileum was normal.  Biopsies were taken.  The transverse colon and cecum appeared normal.  Biopsies were taken.  Few small mouth diverticula were found in the sigmoid colon Pathology terminal ileum: Ileal mucosa with no diagnostic abnormalities, no  evidence of inflammatory bowel disease.  Cecum: No evidence of active inflammatory bowel disease, colonic mucosa with no diagnostic abnormalities, negative for dysplasia Colon biopsy, ascending colon: Mucosa with architectural distortion  compatible with prior mucosal damage.  No evidence of active idiopathic inflammatory bowel disease.  Colon biopsy, transverse colon: Mucosa with no diagnostic abnormalities, no evidence of active inflammatory bowel disease.  Colon biopsy descending colon: Focal active colitis, negative for dysplasia or malignancy.  Colon biopsy sigmoid colon and rectum: Focal active cryptitis, negative for dysplasia or malignancy  History reviewed. No pertinent past medical history.  Past Surgical History:  Procedure Laterality Date   CESAREAN SECTION     FLEXIBLE SIGMOIDOSCOPY N/A 09/01/2019   Procedure: FLEXIBLE SIGMOIDOSCOPY;  Surgeon: Lin Landsman, MD;  Location: [facility] ENDOSCOPY;  Service: Gastroenterology;  Laterality: N/A;   Current Outpatient Medications:    BAC 50-325-40 MG tablet, TAKE 1-2 TABLETS BY MOUTH EVERY 4 HOURS AS NEEDED FOR MIGRAINE, DO NOT EXCEED 6 TABLETS IN 24 HOURS., Disp: , Rfl:    No family history on file.   Social History   Tobacco Use   Smoking status: Some Days    Types: Cigarettes   Smokeless tobacco: Never  Vaping Use   Vaping Use: Never used  Substance Use Topics   Alcohol use: No   Drug use: No    Allergies as of 01/19/2021 - Review Complete 01/19/2021  Allergen Reaction Noted   Asa [aspirin] Hives and Nausea And Vomiting 12/17/2014   Ibuprofen Hives and Nausea And Vomiting 10/01/2018   Pollen extract Other (See Comments) 04/24/2013     Imaging Studies: Reviewed  Assessment and Plan:   Penny Hardy is a 40 y.o. female with recurrent C. difficile infection.  Patient underwent upper endoscopy, colonoscopy, flexible sigmoidoscopy with no evidence of inflammatory bowel disease.  Patient is treated with second course of vancomycin for recent C. difficile infection  Recheck stool studies to rule out recurrence of C. difficile infection Recheck fecal calprotectin levels   Follow Up Instructions:   I discussed the assessment and treatment plan with  the patient. The patient was provided an opportunity to ask questions and all were answered. The patient agreed with the plan and demonstrated an understanding of the instructions.   The patient was advised to call back or seek an in-person evaluation if the symptoms worsen or if the condition fails to improve as anticipated.  I provided 15 minutes of face-to-face time during this encounter.   Follow up in 3 months   Cephas Darby, MD

## 2021-01-20 ENCOUNTER — Encounter: Payer: Self-pay | Admitting: Gastroenterology

## 2021-02-14 ENCOUNTER — Encounter: Payer: Self-pay | Admitting: Gastroenterology

## 2021-02-14 LAB — CALPROTECTIN, FECAL: Calprotectin, Fecal: 449 ug/g — ABNORMAL HIGH (ref 0–120)

## 2021-02-14 LAB — C DIFFICILE TOXINS A+B W/RFLX: C difficile Toxins A+B, EIA: NEGATIVE

## 2021-02-14 LAB — C DIFFICILE, CYTOTOXIN B

## 2021-02-15 ENCOUNTER — Telehealth: Payer: Self-pay

## 2021-02-15 NOTE — Telephone Encounter (Signed)
Patient states she did not know she had iron def anemia and does not want to use that as a diagnosis if she does not have it. Look at patient lab work and she has had only stool test in the past year and no blood work. Please let me know for the patient when and how she was diagnose with Iron def anemia

## 2021-02-15 NOTE — Telephone Encounter (Signed)
Called and left a message for call back  

## 2021-02-15 NOTE — Telephone Encounter (Signed)
-----   Message from Lin Landsman, MD sent at 02/15/2021  9:46 AM EDT ----- Regarding: VCE Let's schedule her VCE again with diagnosis as IDA Hopefully, her insurance will cover this time  Thanks RV

## 2021-03-04 NOTE — Telephone Encounter (Signed)
Sent patient a mychart message to find out if she wanted to try one more time for capsule study and or come in to discuss

## 2021-03-04 NOTE — Telephone Encounter (Signed)
She does not have known h/o iron deficiency and she is not anemic.  Her VCE has been denied twice by her insurance company, therefore I was trying to get it approved with alternative diagnosis  We can try to order VCE one more time Dx: Elevated fecal calprotectin levels, evaluate for small bowel Crohn's  Cephas Darby, MD [address]  [place]  [place], [place] [address]  Main: [phone]  Fax: [phone] Pager: [phone]

## 2021-08-15 ENCOUNTER — Telehealth: Payer: Self-pay

## 2021-08-15 MED ORDER — HYDROCORTISONE (PERIANAL) 2.5 % EX CREA: 1.0000 "application " | TOPICAL_CREAM | Freq: Two times a day (BID) | CUTANEOUS | 0 refills | Status: DC

## 2021-08-15 NOTE — Telephone Encounter (Signed)
Sent medication to the pharmacy. Called and informed patient of this information

## 2021-08-15 NOTE — Addendum Note (Signed)
Addended by: Ulyess Blossom L on: 08/15/2021 02:42 PM   Modules accepted: Orders

## 2021-08-15 NOTE — Telephone Encounter (Signed)
She can try Anusol cream 2.5%, twice daily per rectum for 2 weeks  RV

## 2021-08-15 NOTE — Telephone Encounter (Signed)
Patient is having hemorrhoids problems she states. She states a hemorrhoid busted on Saturday and had rectal bleeding then. Patient states since then she has not had any bleeding but had had rectal pain and pressure. She made appointment for 09/15/21 and but wants to know what to do since then

## 2021-08-16 ENCOUNTER — Encounter: Payer: Self-pay | Admitting: Gastroenterology

## 2021-08-16 DIAGNOSIS — K644 Residual hemorrhoidal skin tags: Secondary | ICD-10-CM

## 2021-08-16 NOTE — Telephone Encounter (Signed)
Gave patient dates to pick when she wanted capsule study but she never responded to me which date work for her. Patient has follow up appointment schedule with you on 09/15/21

## 2021-08-17 ENCOUNTER — Other Ambulatory Visit: Payer: Self-pay

## 2021-08-17 DIAGNOSIS — K5000 Crohn's disease of small intestine without complications: Secondary | ICD-10-CM

## 2021-08-17 DIAGNOSIS — R195 Other fecal abnormalities: Secondary | ICD-10-CM

## 2021-08-17 MED ORDER — HYDROCORTISONE ACETATE 25 MG RE SUPP: 25.0000 mg | Freq: Two times a day (BID) | RECTAL | 0 refills | Status: DC

## 2021-08-17 NOTE — Telephone Encounter (Signed)
Schedule VCE for 09/06/21. Sent medications to the pharmacy

## 2021-08-22 ENCOUNTER — Encounter: Payer: Self-pay | Admitting: Gastroenterology

## 2021-08-22 ENCOUNTER — Other Ambulatory Visit: Payer: Self-pay

## 2021-08-22 ENCOUNTER — Telehealth (INDEPENDENT_AMBULATORY_CARE_PROVIDER_SITE_OTHER): Payer: Medicaid Other | Admitting: Gastroenterology

## 2021-08-22 DIAGNOSIS — R197 Diarrhea, unspecified: Secondary | ICD-10-CM | POA: Diagnosis not present

## 2021-08-22 NOTE — Telephone Encounter (Signed)
Scheduled patient for 12:45pm

## 2021-08-22 NOTE — Progress Notes (Signed)
[name], MD [address]  Penny  [place], Penny Hardy  Main: [phone]  Fax: [phone]    Gastroenterology Consultation Video Visit  Referring Provider:     Freddy Finner, NP Primary Care Physician:  Freddy Finner, NP Primary Gastroenterologist:  Dr. Cephas Darby Reason for Consultation:     Worsening of diarrhea        HPI:   Penny Hardy is a 41 y.o. female referred by Dr. Freddy Finner, NP  for consultation & management of worsening of diarrhea.  Virtual Visit Video Note  I connected with Penny Hardy on 08/22/21 at 12:45 PM EST by video and verified that I am speaking with the correct person using two identifiers.   I discussed the limitations, risks, security and privacy concerns of performing an evaluation and management service by video and the availability of in person appointments. I also discussed with the patient that there may be a patient responsible charge related to this service. The patient expressed understanding and agreed to proceed.  Location of the Patient: Home  Location of the provider: Office  Persons participating in the visit: Patient and provider only   History of Present Illness: Penny Hardy is a 41 year old female with history of recurrent C. difficile infection, called my office to make an urgent visit due to worsening of diarrhea with rectal bleeding as well as abdominal pain.  She has been having 2 soft bowel movements at baseline, this has worsened within last 1 to 2 weeks associated with generalized body aches, significant fatigue.  She also notices nodules on her neck and her face.  She is requesting prednisone because it worked best for her with immediate relief of symptoms.  Patient is also stressed out as a single mom taking care of 4 children  She underwent work-up for inflammatory bowel disease in the past which has been indeterminate.  Elevated fecal calprotectin levels when C. difficile was  positive, colonoscopy revealed active colitis with no evidence of chronicity, however showed granuloma on the rectal biopsies.  I recommended video capsule endoscopy, however her insurance did not cover capsule study   NSAIDs: None  Antiplts/Anticoagulants/Anti thrombotics: None  GI Procedures:  Flexible sigmoidoscopy 09/01/2019 - Preparation of the colon was fair. - Perianal skin tags found on perianal exam. - Crohn's disease with colonic involvement. Inflammation was found in the rectum, in the sigmoid colon and in the descending colon. This was mild in severity. Biopsied. DIAGNOSIS:  A.  COLON, RANDOM LEFT; COLD BIOPSY:  - COLONIC MUCOSA WITH FOCAL AND MILD EDEMA.  - NEGATIVE FOR MICROSCOPIC COLITIS, FEATURES OF CHRONICITY, DYSPLASIA,  AND MALIGNANCY.   B.  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  - NEGATIVE FOR FEATURES OF CHRONICITY, DYSPLASIA, AND MALIGNANCY   EGD 09/23/2018 The examined esophagus was normal Patchy minimal inflammation characterized by erythema was found in the gastric body and in the gastric antrum, biopsies were performed.  Normal cardia and gastric fundus on retroflexion.  The examined duodenum was normal.  Biopsies were performed to evaluate for celiac disease. Pathology Duodenal biopsy revealed duodenal mucosa with focal gastric metaplasia, no other abnormalities.  Stomach biopsy: Mild to moderate chronic gastritis, negative for Helicobacter pylori   Colonoscopy 09/23/2018 The perianal and digital rectal exam were normal.  A small fistula was found in the distal rectum.  Nonbleeding internal hemorrhoids on retroflexion.  An  area of moderately congested mucosa was found in the rectum, sigmoid colon and the descending colon and in the ascending colon.  Biopsies were taken for histology.  Terminal ileum was normal.  Biopsies were taken.  The transverse colon and cecum  appeared normal.  Biopsies were taken.  Few small mouth diverticula were found in the sigmoid colon Pathology terminal ileum: Ileal mucosa with no diagnostic abnormalities, no evidence of inflammatory bowel disease.  Cecum: No evidence of active inflammatory bowel disease, colonic mucosa with no diagnostic abnormalities, negative for dysplasia Colon biopsy, ascending colon: Mucosa with architectural distortion compatible with prior mucosal damage.  No evidence of active idiopathic inflammatory bowel disease.  Colon biopsy, transverse colon: Mucosa with no diagnostic abnormalities, no evidence of active inflammatory bowel disease.  Colon biopsy descending colon: Focal active colitis, negative for dysplasia or malignancy.  Colon biopsy sigmoid colon and rectum: Focal active cryptitis, negative for dysplasia or malignancy  No past medical history on file.  Past Surgical History:  Procedure Laterality Date   CESAREAN SECTION     FLEXIBLE SIGMOIDOSCOPY N/A 09/01/2019   Procedure: FLEXIBLE SIGMOIDOSCOPY;  Surgeon: Lin Landsman, MD;  Location: [facility] ENDOSCOPY;  Service: Gastroenterology;  Laterality: N/A;    Current Outpatient Medications:    BAC 50-325-40 MG tablet, TAKE 1-2 TABLETS BY MOUTH EVERY 4 HOURS AS NEEDED FOR MIGRAINE, DO NOT EXCEED 6 TABLETS IN 24 HOURS. (Patient not taking: Reported on 08/22/2021), Disp: , Rfl:    hydrocortisone (ANUSOL-HC) 2.5 % rectal cream, Place 1 application rectally 2 (two) times daily. (Patient not taking: Reported on 08/22/2021), Disp: 30 g, Rfl: 0   hydrocortisone (ANUSOL-HC) 25 MG suppository, Place 1 suppository (25 mg total) rectally 2 (two) times daily. (Patient not taking: Reported on 08/22/2021), Disp: 12 suppository, Rfl: 0   phentermine (ADIPEX-P) 37.5 MG tablet, Take 37.5 mg by mouth daily. (Patient not taking: Reported on 08/22/2021), Disp: , Rfl:   No family history on file.   Social History   Tobacco Use   Smoking status: Some Days    Types:  Cigarettes   Smokeless tobacco: Never  Vaping Use   Vaping Use: Never used  Substance Use Topics   Alcohol use: No   Drug use: No    Allergies as of 08/22/2021 - Review Complete 08/22/2021  Allergen Reaction Noted   Asa [aspirin] Hives and Nausea And Vomiting 12/17/2014   Ibuprofen Hives and Nausea And Vomiting 10/01/2018   Pollen extract Other (See Comments) 04/24/2013    Imaging Studies: Reviewed  Assessment and Plan:   Penny Hardy is a 41 y.o. Caucasian female with history of recurrent C. difficile infection s/p vancomycin courses x2 made an urgent follow-up appointment due to worsening of diarrhea, abdominal pain and rectal bleeding.    Recommend GI profile PCR to rule out infectious etiology Check fecal calprotectin levels Check CBC, CMP, CRP Check QuantiFERON gold levels  ?  Inflammatory bowel disease Patient does not have histologic evidence of chronic colitis although her symptoms including polyarthralgias, oral ulcers, painful nodules on the bony prominences, GI symptoms are suggestive of underlying inflammatory bowel disease.  If the stool studies are negative for infection, I will put her on start short course of prednisone and give a trial of anti-TNF therapy.  I discussed regarding the risks and benefits of anti-TNF agents including but not limited to allergic reaction, injection site reactions, risk of infection, lymphoma, skin cancer  Follow Up Instructions:   I discussed the assessment and treatment plan  with the patient. The patient was provided an opportunity to ask questions and all were answered. The patient agreed with the plan and demonstrated an understanding of the instructions.   The patient was advised to call back or seek an in-person evaluation if the symptoms worsen or if the condition fails to improve as anticipated.  I provided 20 minutes of face-to-face time during this encounter.   Follow up in 3 to 4 months   Cephas Darby, MD

## 2021-08-23 ENCOUNTER — Encounter: Payer: Self-pay | Admitting: Gastroenterology

## 2021-08-24 ENCOUNTER — Encounter: Payer: Self-pay | Admitting: Gastroenterology

## 2021-08-24 ENCOUNTER — Other Ambulatory Visit: Payer: Self-pay | Admitting: Gastroenterology

## 2021-08-24 ENCOUNTER — Other Ambulatory Visit: Payer: Self-pay

## 2021-08-24 DIAGNOSIS — K5000 Crohn's disease of small intestine without complications: Secondary | ICD-10-CM

## 2021-08-24 DIAGNOSIS — R195 Other fecal abnormalities: Secondary | ICD-10-CM

## 2021-08-24 DIAGNOSIS — K523 Indeterminate colitis: Secondary | ICD-10-CM

## 2021-08-24 LAB — GI PROFILE, STOOL, PCR

## 2021-08-24 MED ORDER — PREDNISONE 10 MG PO TABS: ORAL_TABLET | ORAL | 0 refills | Status: DC

## 2021-08-24 MED ORDER — PREDNISOLONE SODIUM PHOSPHATE 15 MG/5ML PO SOLN: ORAL | 0 refills | Status: AC

## 2021-08-24 MED ORDER — PREDNISOLONE SODIUM PHOSPHATE 15 MG/5ML PO SOLN: ORAL | 0 refills | Status: DC

## 2021-08-24 NOTE — Telephone Encounter (Signed)
Sent over to [facility]

## 2021-08-24 NOTE — Progress Notes (Signed)
Had to reorder because qty was not right

## 2021-08-25 LAB — CALPROTECTIN, FECAL: Calprotectin, Fecal: 1735 ug/g — ABNORMAL HIGH (ref 0–120)

## 2021-08-25 NOTE — Telephone Encounter (Signed)
Capsule study is pending for approval but is schedule for patient on 09/06/21. Will place referral to [facility]

## 2021-08-25 NOTE — Telephone Encounter (Signed)
Place referral to [facility] rheumatologist. Faxed referral to [phone]. Called them at [phone] to get fax number.

## 2021-08-26 ENCOUNTER — Other Ambulatory Visit: Payer: Self-pay | Admitting: Gastroenterology

## 2021-08-26 ENCOUNTER — Other Ambulatory Visit: Payer: Self-pay

## 2021-08-26 DIAGNOSIS — K523 Indeterminate colitis: Secondary | ICD-10-CM

## 2021-08-26 LAB — QUANTIFERON-TB GOLD PLUS
QuantiFERON Mitogen Value: >10 IU/mL
QuantiFERON Nil Value: 0.03 IU/mL
QuantiFERON TB1 Ag Value: 0.02 IU/mL
QuantiFERON TB2 Ag Value: 0.03 IU/mL
QuantiFERON-TB Gold Plus: NEGATIVE

## 2021-08-26 LAB — COMPREHENSIVE METABOLIC PANEL
ALT: 16 IU/L (ref 0–32)
AST: 12 IU/L (ref 0–40)
Albumin/Globulin Ratio: 1.4 (ref 1.2–2.2)
Albumin: 4.2 g/dL (ref 3.8–4.8)
Alkaline Phosphatase: 95 IU/L (ref 44–121)
BUN/Creatinine Ratio: 13 (ref 9–23)
BUN: 10 mg/dL (ref 6–24)
Bilirubin Total: <0.2 mg/dL (ref 0.0–1.2)
CO2: 21 mmol/L (ref 20–29)
Calcium: 8.8 mg/dL (ref 8.7–10.2)
Chloride: 103 mmol/L (ref 96–106)
Creatinine, Ser: 0.79 mg/dL (ref 0.57–1.00)
Globulin, Total: 2.9 g/dL (ref 1.5–4.5)
Glucose: 79 mg/dL (ref 70–99)
Potassium: 4.1 mmol/L (ref 3.5–5.2)
Sodium: 138 mmol/L (ref 134–144)
Total Protein: 7.1 g/dL (ref 6.0–8.5)
eGFR: 97 mL/min/{1.73_m2} (ref >59)

## 2021-08-26 LAB — CBC
Hematocrit: 35.4 % (ref 34.0–46.6)
Hemoglobin: 11.8 g/dL (ref 11.1–15.9)
MCH: 29.6 pg (ref 26.6–33.0)
MCHC: 33.3 g/dL (ref 31.5–35.7)
MCV: 89 fL (ref 79–97)
Platelets: 379 10*3/uL (ref 150–450)
RBC: 3.99 x10E6/uL (ref 3.77–5.28)
RDW: 11.8 % (ref 11.7–15.4)
WBC: 8.4 10*3/uL (ref 3.4–10.8)

## 2021-08-26 LAB — C-REACTIVE PROTEIN: CRP: 48 mg/L — ABNORMAL HIGH (ref 0–10)

## 2021-08-30 ENCOUNTER — Telehealth: Payer: Self-pay

## 2021-08-30 ENCOUNTER — Other Ambulatory Visit: Payer: Self-pay

## 2021-08-30 DIAGNOSIS — K5000 Crohn's disease of small intestine without complications: Secondary | ICD-10-CM

## 2021-08-30 DIAGNOSIS — R195 Other fecal abnormalities: Secondary | ICD-10-CM

## 2021-08-30 MED ORDER — NA SULFATE-K SULFATE-MG SULF 17.5-3.13-1.6 GM/177ML PO SOLN: 354.0000 mL | Freq: Once | ORAL | 0 refills | Status: AC

## 2021-08-30 NOTE — Telephone Encounter (Signed)
MRI is schedule for 09/12/2021 at 10:00am. Please arrive 8:30am. Nothing to eat or drink 4 hours prior. Colonoscopy scheduled for 09/15/21. Called patient verbalized understanding of instructions and sent instructions to [facility]

## 2021-08-30 NOTE — Telephone Encounter (Signed)
Patient came by the office today to get lab work and when she was here she states that DR. [place] told her several years ago she had a fissure. She states she thinks the fissure has open up because when she wipes in the front she see stool.

## 2021-08-31 ENCOUNTER — Encounter: Payer: Self-pay | Admitting: Gastroenterology

## 2021-08-31 LAB — IGG, IGA, IGM
IgA/Immunoglobulin A, Serum: 312 mg/dL (ref 87–352)
IgG (Immunoglobin G), Serum: 1244 mg/dL (ref 586–1602)
IgM (Immunoglobulin M), Srm: 160 mg/dL (ref 26–217)

## 2021-08-31 NOTE — Telephone Encounter (Signed)
Penny Hardy said she drew all 3 lab test

## 2021-09-01 ENCOUNTER — Telehealth: Payer: Self-pay

## 2021-09-01 NOTE — Telephone Encounter (Signed)
[facility] called they said they called to check the status with patient insurance company of her Humira. She states that the medication was denied because the wrong form was used. She staid insurance company said we needed to call and do the PA over the phone she said to call [phone]. Reference number is [id number] She talk to Butch Penny. Called and talk to Butch Penny with [employer] and did the PA over the phone. They sent PA over for review. [id number] They said we could call in 24 hours to check the status of the medication. Call reference number is [id number]

## 2021-09-04 ENCOUNTER — Encounter: Payer: Self-pay | Admitting: Gastroenterology

## 2021-09-04 LAB — ALPHA-GAL PANEL
Allergen Lamb IgE: <0.1 kU/L
Beef IgE: <0.1 kU/L
IgE (Immunoglobulin E), Serum: 700 IU/mL — ABNORMAL HIGH (ref 6–495)
O215-IgE Alpha-Gal: <0.1 kU/L
Pork IgE: <0.1 kU/L

## 2021-09-04 LAB — FOOD ALLERGY PROFILE
Allergen Corn, IgE: 0.25 kU/L — AB
Clam IgE: 0.94 kU/L — AB
Codfish IgE: <0.1 kU/L
Egg White IgE: <0.1 kU/L
Milk IgE: 0.12 kU/L — AB
Peanut IgE: 0.31 kU/L — AB
Scallop IgE: 0.48 kU/L — AB
Sesame Seed IgE: 0.3 kU/L — AB
Shrimp IgE: 1.98 kU/L — AB
Soybean IgE: 0.2 kU/L — AB
Walnut IgE: 0.26 kU/L — AB
Wheat IgE: 0.3 kU/L — AB

## 2021-09-05 NOTE — Telephone Encounter (Signed)
Capsules study is still pending with insurance called to check the status. Called patient and moved her capsule study to 09/14/21. Patient states that her Humira got denied by insurance. Patient is going to email me the denial letter she received because we have not received anything

## 2021-09-06 ENCOUNTER — Telehealth: Payer: Self-pay

## 2021-09-06 NOTE — Telephone Encounter (Signed)
Per [name] with [facility] Littie Deeds. - PA pending for Humira .  Gwenn and I are checking on her this morning.  Informed [name] that patient states the medication was denied by insurance. Asked if they could do a appeal on the medication.

## 2021-09-07 ENCOUNTER — Telehealth: Payer: Self-pay

## 2021-09-07 DIAGNOSIS — Z91018 Allergy to other foods: Secondary | ICD-10-CM

## 2021-09-07 NOTE — Telephone Encounter (Signed)
Placed referral to [place]  ?

## 2021-09-07 NOTE — Telephone Encounter (Signed)
-----   Message from Lin Landsman, MD sent at 09/07/2021  3:44 PM EST ----- ?Referral to allergy and immunology ?Dx: Abnormal food allergy profile ? ?RV ?----- Message ----- ?From: Shelby Mattocks, CMA ?Sent: 09/07/2021   1:12 PM EST ?To: Lin Landsman, MD ? ?You told me in passing to refer this patient somewhere but I do not remember what you said and what was the diagnosis.  ?Thank you  ? ? ?

## 2021-09-07 NOTE — Telephone Encounter (Signed)
Penny Hardy with [facility] called and asked if we could call insurance to get the denial fax to [place] and then fax to them. Called the [place] [facility] and they said the medication is now approved by her insurance. [id number] good from 09/01/21 to 08/27/22. Emailed Higher education careers adviser and informed Penny Hardy of this information and informed patient.   ?

## 2021-09-08 ENCOUNTER — Telehealth: Payer: Self-pay

## 2021-09-08 DIAGNOSIS — K5000 Crohn's disease of small intestine without complications: Secondary | ICD-10-CM

## 2021-09-08 DIAGNOSIS — R195 Other fecal abnormalities: Secondary | ICD-10-CM

## 2021-09-08 NOTE — Telephone Encounter (Signed)
Per Radiology: Hello,per protocol this patient will need an entero abdomen w/wo also.Thanks ? ?Order this per radiology  ? ?

## 2021-09-08 NOTE — Telephone Encounter (Signed)
Medicaid denied the capsule study for patient . They said she needed 3 negative EGD, a recent colonoscopy that is negative, lab work. Informed patient that the Capsule study was denied. She has colonoscopy schedule for 09/15/21 ?

## 2021-09-12 ENCOUNTER — Ambulatory Visit
Admission: RE | Admit: 2021-09-12 | Discharge: 2021-09-12 | Disposition: A | Discharge status: Home / Self Care | Payer: Medicaid Other | Source: Ambulatory Visit | Attending: Gastroenterology | Admitting: Gastroenterology

## 2021-09-12 ENCOUNTER — Other Ambulatory Visit: Payer: Self-pay

## 2021-09-12 ENCOUNTER — Encounter: Payer: Self-pay | Admitting: Gastroenterology

## 2021-09-12 DIAGNOSIS — K5000 Crohn's disease of small intestine without complications: Secondary | ICD-10-CM | POA: Diagnosis not present

## 2021-09-12 DIAGNOSIS — R195 Other fecal abnormalities: Secondary | ICD-10-CM | POA: Insufficient documentation

## 2021-09-12 MED ORDER — GADOBUTROL 1 MMOL/ML IV SOLN
10.0000 mL | Freq: Once | INTRAVENOUS | Status: AC | PRN
  Administered 2021-09-12: 10 mL via INTRAVENOUS

## 2021-09-13 ENCOUNTER — Other Ambulatory Visit: Payer: Self-pay

## 2021-09-13 ENCOUNTER — Encounter: Payer: Self-pay | Admitting: Emergency Medicine

## 2021-09-13 ENCOUNTER — Emergency Department: Payer: Medicaid Other

## 2021-09-13 ENCOUNTER — Emergency Department
Admission: EM | Admit: 2021-09-13 | Discharge: 2021-09-13 | Disposition: A | Discharge status: Home / Self Care | Payer: Medicaid Other | Attending: Emergency Medicine | Admitting: Emergency Medicine

## 2021-09-13 ENCOUNTER — Telehealth: Payer: Self-pay

## 2021-09-13 DIAGNOSIS — N12 Tubulo-interstitial nephritis, not specified as acute or chronic: Secondary | ICD-10-CM | POA: Diagnosis not present

## 2021-09-13 DIAGNOSIS — R1013 Epigastric pain: Secondary | ICD-10-CM

## 2021-09-13 DIAGNOSIS — R197 Diarrhea, unspecified: Secondary | ICD-10-CM

## 2021-09-13 DIAGNOSIS — R112 Nausea with vomiting, unspecified: Secondary | ICD-10-CM | POA: Diagnosis present

## 2021-09-13 LAB — COMPREHENSIVE METABOLIC PANEL
ALT: 20 U/L (ref 0–44)
AST: 14 U/L — ABNORMAL LOW (ref 15–41)
Albumin: 2.8 g/dL — ABNORMAL LOW (ref 3.5–5.0)
Alkaline Phosphatase: 80 U/L (ref 38–126)
Anion gap: 9 (ref 5–15)
BUN: 22 mg/dL — ABNORMAL HIGH (ref 6–20)
CO2: 25 mmol/L (ref 22–32)
Calcium: 8.2 mg/dL — ABNORMAL LOW (ref 8.9–10.3)
Chloride: 103 mmol/L (ref 98–111)
Creatinine, Ser: 0.79 mg/dL (ref 0.44–1.00)
GFR, Estimated: >60 mL/min (ref >60)
Glucose, Bld: 94 mg/dL (ref 70–99)
Potassium: 3.4 mmol/L — ABNORMAL LOW (ref 3.5–5.1)
Sodium: 137 mmol/L (ref 135–145)
Total Bilirubin: 0.3 mg/dL (ref 0.3–1.2)
Total Protein: 6.9 g/dL (ref 6.5–8.1)

## 2021-09-13 LAB — URINALYSIS, ROUTINE W REFLEX MICROSCOPIC
Bilirubin Urine: NEGATIVE
Glucose, UA: NEGATIVE mg/dL
Ketones, ur: NEGATIVE mg/dL
Nitrite: NEGATIVE
Protein, ur: 30 mg/dL — AB
Specific Gravity, Urine: 1.027 (ref 1.005–1.030)
WBC, UA: >50 WBC/hpf — ABNORMAL HIGH (ref 0–5)
pH: 5 (ref 5.0–8.0)

## 2021-09-13 LAB — LIPASE, BLOOD: Lipase: 27 U/L (ref 11–51)

## 2021-09-13 LAB — CBC
HCT: 41.2 % (ref 36.0–46.0)
Hemoglobin: 12.9 g/dL (ref 12.0–15.0)
MCH: 29.2 pg (ref 26.0–34.0)
MCHC: 31.3 g/dL (ref 30.0–36.0)
MCV: 93.2 fL (ref 80.0–100.0)
Platelets: 335 10*3/uL (ref 150–400)
RBC: 4.42 MIL/uL (ref 3.87–5.11)
RDW: 13.6 % (ref 11.5–15.5)
WBC: 10.7 10*3/uL — ABNORMAL HIGH (ref 4.0–10.5)
nRBC: 0 % (ref 0.0–0.2)

## 2021-09-13 LAB — LACTIC ACID, PLASMA: Lactic Acid, Venous: 1.5 mmol/L (ref 0.5–1.9)

## 2021-09-13 LAB — POC URINE PREG, ED: Preg Test, Ur: NEGATIVE

## 2021-09-13 MED ORDER — HYDROMORPHONE HCL 1 MG/ML IJ SOLN
0.5000 mg | Freq: Once | INTRAMUSCULAR | Status: AC
  Administered 2021-09-13: 0.5 mg via INTRAVENOUS
  Filled 2021-09-13: qty 1

## 2021-09-13 MED ORDER — POTASSIUM CHLORIDE CRYS ER 20 MEQ PO TBCR
40.0000 meq | EXTENDED_RELEASE_TABLET | Freq: Once | ORAL | Status: AC
  Administered 2021-09-13: 40 meq via ORAL
  Filled 2021-09-13: qty 2

## 2021-09-13 MED ORDER — DROPERIDOL 2.5 MG/ML IJ SOLN
1.2500 mg | Freq: Once | INTRAMUSCULAR | Status: AC
  Administered 2021-09-13: 1.25 mg via INTRAVENOUS
  Filled 2021-09-13: qty 2

## 2021-09-13 MED ORDER — SODIUM CHLORIDE 0.9 % IV BOLUS
1000.0000 mL | Freq: Once | INTRAVENOUS | Status: AC
Start: 2021-09-13 — End: 2021-09-13
  Administered 2021-09-13: 1000 mL via INTRAVENOUS

## 2021-09-13 MED ORDER — SODIUM CHLORIDE 0.9 % IV BOLUS
1000.0000 mL | Freq: Once | INTRAVENOUS | Status: AC
  Administered 2021-09-13: 1000 mL via INTRAVENOUS

## 2021-09-13 MED ORDER — CEFDINIR 300 MG PO CAPS: 300.0000 mg | ORAL_CAPSULE | Freq: Two times a day (BID) | ORAL | 0 refills | Status: AC

## 2021-09-13 MED ORDER — ONDANSETRON 4 MG PO TBDP: 4.0000 mg | ORAL_TABLET | Freq: Three times a day (TID) | ORAL | 0 refills | Status: AC | PRN

## 2021-09-13 MED ORDER — SODIUM CHLORIDE 0.9 % IV SOLN
1.0000 g | Freq: Once | INTRAVENOUS | Status: AC
  Administered 2021-09-13: 1 g via INTRAVENOUS
  Filled 2021-09-13: qty 10

## 2021-09-13 MED ORDER — ONDANSETRON HCL 4 MG/2ML IJ SOLN
4.0000 mg | Freq: Once | INTRAMUSCULAR | Status: AC
  Administered 2021-09-13: 4 mg via INTRAVENOUS
  Filled 2021-09-13: qty 2

## 2021-09-13 NOTE — Telephone Encounter (Signed)
[name] states the hold up with insurance is a interaction with humira and one of her other medications. They would not give Penny Hardy the interaction states the provider had to call. [name] put in all her medications with the humira and there was a major interaction with the Humira and Prednisone it could cause infection. Called [place] [facility] and they said to tell the pharmacy to use the over ride codes reason service code DC, Professional service code M0 and Result service code 1B.  ?Called rick and gave him these codes are the phone and emailed them to him  ?

## 2021-09-13 NOTE — ED Triage Notes (Signed)
C/O  left sided pain and vomiting today.  Patient states has been sick for 'a while'.  Had an MRI yesterday and was sick from the contrast with diarrhea, but today also vomiting. ? ?AAOx3.  Skin warm and dry no apparent distress  ?

## 2021-09-13 NOTE — Telephone Encounter (Signed)
Per [name] with [facility] This patient is approved but requires ?a high cost override? by [employer].  This is delaying her shipment.  Silvestre Moment is calling [place] [employer] now to turn the heat up on them.  She likes fighting with insurance. :) ? ?  ? ?We see this sometimes with expensive meds like biologics and Hep-C drugs but it typically takes 1-3 days to be reviewed so something is not right.  She is calling now so hopefully she can make them fix it now.  ? ?  ? ?I will keep you posted. ?

## 2021-09-13 NOTE — Discharge Instructions (Signed)
You can take Tylenol 1 g every 8 hours to help with any pain.  We have prescribed some antibiotics in case you have a UTI but we will know for sure until your urine culture comes back.  I prescribed Zofran to help with nausea.  You can follow-up with your GI team as planned and return to the ER if you develop worsening symptoms or any other concerns ?

## 2021-09-13 NOTE — ED Notes (Signed)
Pt teaching provided on medications that may cause drowsiness. Pt instructed not to drive or operate heavy machinery while taking the prescribed medication. Pt verbalized understanding.  ? ?Pt provided discharge instructions and prescription information. Pt was given the opportunity to ask questions and questions were answered. Discharge signature not obtained in the setting of the COVID-19 pandemic in order to reduce high touch surfaces.  ? ?

## 2021-09-13 NOTE — ED Provider Notes (Addendum)
? ?[facility] ?Provider Note ? ? ? Event Date/Time  ? First MD Initiated Contact with Patient 09/13/21 1617   ?  (approximate) ? ? ?History  ? ?Abdominal pain. ? ?HPI ? ?Penny Hardy is a 41 y.o. female who has been worked up by GI for possible inflammatory bowel disease with a planned colonoscopy on Thursday who comes in with nausea, vomiting, diarrhea.  Patient had MRI done yesterday that showed large gallstone but also colitis that she is going to have a colonoscopy done in 2 days.  She reports that since getting the IV dye that she has been having a lot of nausea vomiting and diarrhea.  She states that she tried to go to work today just did not feel well and so came into the emergency room instead.  I reviewed the note from Dr. Marius Ditch regarding the MRI which it sounds that they are thinking about starting her on Humira.  She denies any blood in her stool.. Denies any cp or SOB ? ? ?Physical Exam  ? ?Triage Vital Signs: ?ED Triage Vitals [09/13/21 1555]  ?Enc Vitals Group  ?   BP 106/65  ?   Pulse Rate (!) 102  ?   Resp 16  ?   Temp 97.9 ?F (36.6 ?C)  ?   Temp Source Oral  ?   SpO2 100 %  ?   Weight   ?   Height   ?   Head Circumference   ?   Peak Flow   ?   Pain Score 7  ?   Pain Loc   ?   Pain Edu?   ?   Excl. in [place]?   ? ? ?Most recent vital signs: ?Vitals:  ? 09/13/21 1555  ?BP: 106/65  ?Pulse: (!) 102  ?Resp: 16  ?Temp: 97.9 ?F (36.6 ?C)  ?SpO2: 100%  ? ? ? ?General: Awake, no distress.  ?CV:  Good peripheral perfusion.  ?Resp:  Normal effort.  ?Abd:  No distention.  Patient reports some tenderness in her upper abdomen without any rebound or guarding but more on the left side than the right side. ?Other:   ? ? ?ED Results / Procedures / Treatments  ? ?Labs ?(all labs ordered are listed, but only abnormal results are displayed) ?Labs Reviewed  ?COMPREHENSIVE METABOLIC PANEL - Abnormal; Notable for the following components:  ?    Result Value  ? Potassium 3.4 (*)   ? BUN 22 (*)   ?  Calcium 8.2 (*)   ? Albumin 2.8 (*)   ? AST 14 (*)   ? All other components within normal limits  ?CBC - Abnormal; Notable for the following components:  ? WBC 10.7 (*)   ? All other components within normal limits  ?URINALYSIS, ROUTINE W REFLEX MICROSCOPIC - Abnormal; Notable for the following components:  ? Color, Urine YELLOW (*)   ? APPearance CLOUDY (*)   ? Hgb urine dipstick MODERATE (*)   ? Protein, ur 30 (*)   ? Leukocytes,Ua LARGE (*)   ? WBC, UA >50 (*)   ? Bacteria, UA MANY (*)   ? All other components within normal limits  ?LIPASE, BLOOD  ?LACTIC ACID, PLASMA  ?POC URINE PREG, ED  ? ? ?RADIOLOGY ?I have reviewed the [place] personally- large gallstone  ? ? ?PROCEDURES: ? ?Critical Care performed: No ? ?Procedures ? ? ?MEDICATIONS ORDERED IN ED: ?Medications  ?sodium chloride 0.9 % bolus 1,000 mL (1,000 mLs Intravenous  New Bag/Given 09/13/21 1704)  ?HYDROmorphone (DILAUDID) injection 0.5 mg (0.5 mg Intravenous Given 09/13/21 1706)  ?ondansetron (ZOFRAN) injection 4 mg (4 mg Intravenous Given 09/13/21 1704)  ? ? ? ?IMPRESSION / MDM / ASSESSMENT AND PLAN / ED COURSE  ?I reviewed the triage vital signs and the nursing notes. ?             ?               ? ?Patient comes in with chronic abdominal pain currently being worked up by GI with worsening symptoms secondary to MRI contrast.  Suspect this is related to the contrast but given patient does report some upper abdominal pain in the setting of known gallstone on MRI will get ultrasound to make sure no development of cholecystitis.  We will give patient a liter of fluids, Dilaudid, Zofran to help with symptoms.  Pregnancy test was negative, UA with significant amount of WBCs will send for culture lipase normal, CMP shows low potassium.  White count slightly elevated at 10.7 ? ?Ultrasound without evidence of cholecystitis.  Does have large gallstone.  Patient is now stating her pain is more on her left side.  We will give her outpatient follow-up for surgery but do not  feel like she has cholecystitis based upon examination. ? ?On repeat evaluation patient is feeling better.  She does report some increasing urinary frequency at nighttime.  And her urine looks concerning for UTI.  I have sent this for culture.  I did review the MRI she does not have any kidney stones.  Patient is reporting some left flank tenderness so we will treat this as possible pyelonephritis with some cefdinir.  I explained that her pain could be related to this versus the colitis but she is working on getting Humira approved by her insurance company and already has a colonoscopy planned for 2 days.  At this time she feels comfortable with discharge home with some Zofran, antibiotic and will follow-up outpatient with GI as planned ? ?7:04 PM lactate normal patient now having more abdominal pain and nausea.  Patient will be given a dose of Zofran and then a dose of droperidol as well as additional liter of fluids.  Do not feel repeat CT imaging at this time would be warranted given reassuring MRI patient does report some pain related to her colitis but she is getting this worked up outpatient and this could be causing some of her discomfort versus possible pyelonephritis.  Patient would also like to hold off on CT at this time.  She is got no rebound or guarding I do not think she is peritonitic or has evidence of bowel perforation. ? ?8:07 PM repeat abdomen remains soft and nontender without any rebound or guarding.  Patient reports feeling much better and feels comfortable with discharge home and will return if she develops worsening pain.  Again she is wanting to hold off on CT imaging which I think is reasonable given her reassuring exam.  Patient is tolerated drinking some water at bedside and feels comfortable with discharge home ? ?10:14 PM reevaluated patient.  Blood pressure is still low.  Patient's cuff looks fairly large on her so changed to medium cuff and her blood pressure was 90/50.  Patient  reports feeling much better her abdomen is soft and nontender but given her continued lower blood pressures we discussed CT imaging and giving her more fluids.  Patient is declining stating that she is feeling a lot  better she wants to try to eat something and to walk around.  Explained to patient the risk of passing out with her low blood pressure but patient states that she is feeling a lot better at this time declines any other imaging. ? ?10:37 PM manual blood pressure done it was 108/62 and patient continues to request discharge ? ?I discussed the provisional nature of ED diagnosis, the treatment so far, the ongoing plan of care, follow up appointments and return precautions with the patient and any family or support people present. They expressed understanding and agreed with the plan, discharged home. ? ? ? ? ? ? ? ?FINAL CLINICAL IMPRESSION(S) / ED DIAGNOSES  ? ?Final diagnoses:  ?Epigastric abdominal pain  ?Pyelonephritis  ?Nausea vomiting and diarrhea  ? ? ? ?Rx / DC Orders  ? ?ED Discharge Orders   ? ?      Ordered  ?  ondansetron (ZOFRAN-ODT) 4 MG disintegrating tablet  Every 8 hours PRN       ? 09/13/21 2008  ?  cefdinir (OMNICEF) 300 MG capsule  2 times daily       ? 09/13/21 2008  ? ?  ?  ? ?  ? ? ? ?Note:  This document was prepared using Dragon voice recognition software and may include unintentional dictation errors. ?  ?Vanessa Dubberly, MD ?09/13/21 2009 ? ?  ?Vanessa [place], MD ?09/13/21 2237 ? ?  ?Vanessa [place], MD ?09/13/21 2250 ? ?

## 2021-09-14 ENCOUNTER — Ambulatory Visit: Admission: RE | Admit: 2021-09-14 | Payer: Medicaid Other | Source: Home / Self Care | Admitting: Gastroenterology

## 2021-09-14 ENCOUNTER — Telehealth: Payer: Self-pay

## 2021-09-14 ENCOUNTER — Other Ambulatory Visit: Payer: Self-pay

## 2021-09-14 ENCOUNTER — Encounter: Admission: RE | Payer: Self-pay | Source: Home / Self Care

## 2021-09-14 SURGERY — IMAGING PROCEDURE, GI TRACT, INTRALUMINAL, VIA CAPSULE
Anesthesia: General

## 2021-09-14 NOTE — Telephone Encounter (Signed)
Per [name] with [facility]:  ?Patient Penny Hardy July 12, 1980 is ready to ship and Silvestre Moment made a call to her but was not able to reach her to set up shipment.  Patient can call us for overnight shipment in case you may be able to let her know. ? ?  ? ?She can call: [phone]. ? ?Sent patient a mychart message  ?

## 2021-09-15 ENCOUNTER — Encounter
Admission: RE | Disposition: A | Discharge status: Home / Self Care | Payer: Self-pay | Source: Home / Self Care | Attending: Gastroenterology

## 2021-09-15 ENCOUNTER — Ambulatory Visit: Payer: Medicaid Other | Admitting: Gastroenterology

## 2021-09-15 ENCOUNTER — Ambulatory Visit: Payer: Medicaid Other | Admitting: Certified Registered"

## 2021-09-15 ENCOUNTER — Ambulatory Visit
Admission: RE | Admit: 2021-09-15 | Discharge: 2021-09-15 | Disposition: A | Discharge status: Home / Self Care | Payer: Medicaid Other | Attending: Gastroenterology | Admitting: Gastroenterology

## 2021-09-15 ENCOUNTER — Encounter: Payer: Self-pay | Admitting: Gastroenterology

## 2021-09-15 DIAGNOSIS — Z6841 Body Mass Index (BMI) 40.0 and over, adult: Secondary | ICD-10-CM | POA: Diagnosis not present

## 2021-09-15 DIAGNOSIS — K5000 Crohn's disease of small intestine without complications: Secondary | ICD-10-CM

## 2021-09-15 DIAGNOSIS — R195 Other fecal abnormalities: Secondary | ICD-10-CM | POA: Diagnosis not present

## 2021-09-15 DIAGNOSIS — K50113 Crohn's disease of large intestine with fistula: Secondary | ICD-10-CM | POA: Diagnosis not present

## 2021-09-15 DIAGNOSIS — K644 Residual hemorrhoidal skin tags: Secondary | ICD-10-CM | POA: Insufficient documentation

## 2021-09-15 DIAGNOSIS — K5010 Crohn's disease of large intestine without complications: Secondary | ICD-10-CM | POA: Insufficient documentation

## 2021-09-15 DIAGNOSIS — K6289 Other specified diseases of anus and rectum: Secondary | ICD-10-CM | POA: Diagnosis not present

## 2021-09-15 DIAGNOSIS — K529 Noninfective gastroenteritis and colitis, unspecified: Secondary | ICD-10-CM | POA: Diagnosis present

## 2021-09-15 HISTORY — PX: COLONOSCOPY WITH PROPOFOL: SHX5780

## 2021-09-15 HISTORY — DX: Renal tubulo-interstitial disease, unspecified: N15.9

## 2021-09-15 LAB — URINE CULTURE

## 2021-09-15 LAB — POCT PREGNANCY, URINE: Preg Test, Ur: NEGATIVE

## 2021-09-15 SURGERY — COLONOSCOPY WITH PROPOFOL
Anesthesia: General

## 2021-09-15 MED ORDER — PROPOFOL 500 MG/50ML IV EMUL
INTRAVENOUS | Status: DC | PRN
  Administered 2021-09-15: 150 ug/kg/min via INTRAVENOUS

## 2021-09-15 MED ORDER — PROPOFOL 10 MG/ML IV BOLUS
INTRAVENOUS | Status: DC | PRN
  Administered 2021-09-15: 100 mg via INTRAVENOUS

## 2021-09-15 MED ORDER — LIDOCAINE HCL (CARDIAC) PF 100 MG/5ML IV SOSY
PREFILLED_SYRINGE | INTRAVENOUS | Status: DC | PRN
Start: 2021-09-15 — End: 2021-09-15
  Administered 2021-09-15: 50 mg via INTRAVENOUS

## 2021-09-15 MED ORDER — SODIUM CHLORIDE 0.9 % IV SOLN: INTRAVENOUS | Status: DC

## 2021-09-15 NOTE — H&P (Signed)
?Cephas Darby, MD ?[address]  ?[address]  ?[place], [place] [address]  ?Main: [phone]  ?Fax: [phone] ?Pager: [phone] ? ?Primary Care Physician:  Freddy Finner, NP ?Primary Gastroenterologist:  Dr. Cephas Darby ? ?Pre-Procedure History & Physical: ?HPI:  Penny Hardy is a 41 y.o. female is here for an colonoscopy. ?  ?Past Medical History:  ?Diagnosis Date  ? Kidney infection   ? ? ?Past Surgical History:  ?Procedure Laterality Date  ? CESAREAN SECTION    ? FLEXIBLE SIGMOIDOSCOPY N/A 09/01/2019  ? Procedure: FLEXIBLE SIGMOIDOSCOPY;  Surgeon: Lin Landsman, MD;  Location: [facility] ENDOSCOPY;  Service: Gastroenterology;  Laterality: N/A;  ? ? ?Prior to Admission medications   ?Medication Sig Start Date End Date Taking? Authorizing Provider  ?cefdinir (OMNICEF) 300 MG capsule Take 1 capsule (300 mg total) by mouth 2 (two) times daily for 10 days. 09/13/21 09/23/21 Yes Vanessa Killdeer, MD  ?ondansetron (ZOFRAN-ODT) 4 MG disintegrating tablet Take 1 tablet (4 mg total) by mouth every 8 (eight) hours as needed for up to 5 days for nausea or vomiting. 09/13/21 09/18/21 Yes Vanessa McLean, MD  ?prednisoLONE (ORAPRED) 15 MG/5ML solution Take 13.3 mLs (40 mg total) by mouth daily before breakfast for 14 days, THEN 10 mLs (30 mg total) daily before breakfast for 7 days, THEN 5 mLs (15 mg total) daily before breakfast for 7 days, THEN 3.3 mLs (10 mg total) daily before breakfast for 7 days. 08/24/21 09/28/21 Yes Sandra Brents, Tally Due, MD  ?BAC 50-325-40 MG tablet TAKE 1-2 TABLETS BY MOUTH EVERY 4 HOURS AS NEEDED FOR MIGRAINE, DO NOT EXCEED 6 TABLETS IN 24 HOURS. ?Patient not taking: Reported on 08/22/2021 06/17/20   [provider]  ?hydrocortisone (ANUSOL-HC) 2.5 % rectal cream Place 1 application rectally 2 (two) times daily. ?Patient not taking: Reported on 08/22/2021 08/15/21   Lin Landsman, MD  ?hydrocortisone (ANUSOL-HC) 25 MG suppository Place 1 suppository (25 mg total) rectally 2 (two)  times daily. ?Patient not taking: Reported on 08/22/2021 08/17/21   Lin Landsman, MD  ?phentermine (ADIPEX-P) 37.5 MG tablet Take 37.5 mg by mouth daily. ?Patient not taking: Reported on 08/22/2021 07/05/21   [provider]  ? ? ?Allergies as of 08/30/2021 - Review Complete 08/22/2021  ?Allergen Reaction Noted  ? Asa [aspirin] Hives and Nausea And Vomiting 12/17/2014  ? Ibuprofen Hives and Nausea And Vomiting 10/01/2018  ? Pollen extract Other (See Comments) 04/24/2013  ? ? ?History reviewed. No pertinent family history. ? ?Social History  ? ?Socioeconomic History  ? Marital status: Single  ?  Spouse name: Not on file  ? Number of children: Not on file  ? Years of education: Not on file  ? Highest education level: Not on file  ?Occupational History  ? Not on file  ?Tobacco Use  ? Smoking status: Some Days  ?  Types: Cigarettes  ? Smokeless tobacco: Never  ?Vaping Use  ? Vaping Use: Never used  ?Substance and Sexual Activity  ? Alcohol use: No  ? Drug use: No  ? Sexual activity: Not Currently  ?Other Topics Concern  ? Not on file  ?Social History Narrative  ? Not on file  ? ?Social Determinants of Health  ? ?Financial Resource Strain: Not on file  ?Food Insecurity: Not on file  ?Transportation Needs: Not on file  ?Physical Activity: Not on file  ?Stress: Not on file  ?Social Connections: Not on file  ?Intimate Partner Violence: Not on file  ? ? ?  Review of Systems: ?See HPI, otherwise negative ROS ? ?Physical Exam: ?BP 133/76   Pulse 68   Temp (!) 96.4 ?F (35.8 ?C) (Temporal)   Resp 18   Ht 5' 1"  (1.549 m)   Wt 109.3 kg   SpO2 100%   BMI 45.54 kg/m?  ?General:   Alert,  pleasant and cooperative in NAD ?Head:  Normocephalic and atraumatic. ?Neck:  Supple; no masses or thyromegaly. ?Lungs:  Clear throughout to auscultation.    ?Heart:  Regular rate and rhythm. ?Abdomen:  Soft, nontender and nondistended. Normal bowel sounds, without guarding, and without rebound.   ?Neurologic:  Alert and  oriented  x4;  grossly normal neurologically. ? ?Impression/Plan: ?TIMIKA MUENCH is here for an colonoscopy to be performed for h/o colitis ? ?Risks, benefits, limitations, and alternatives regarding  colonoscopy have been reviewed with the patient.  Questions have been answered.  All parties agreeable. ? ? ?[name], MD  09/15/2021, 11:06 AM ?

## 2021-09-15 NOTE — Anesthesia Preprocedure Evaluation (Signed)
Anesthesia Evaluation  ?Patient identified by MRN, date of birth, ID band ?Patient awake ? ? ? ?Reviewed: ?Allergy & Precautions, H&P , NPO status , Patient's Chart, lab work & pertinent test results, reviewed documented beta blocker date and time  ? ?History of Anesthesia Complications ?Negative for: history of anesthetic complications ? ?Airway ?Mallampati: II ? ?TM Distance: >3 FB ?Neck ROM: full ? ? ? Dental ? ?(+) Dental Advidsory Given, Teeth Intact ?  ?Pulmonary ?neg shortness of breath, neg COPD, neg recent URI, Current Smoker and Patient abstained from smoking.,  ?  ?Pulmonary exam normal ? ? ? ? ? ? ? Cardiovascular ?Exercise Tolerance: Good ?negative cardio ROS ?Normal cardiovascular exam ? ? ?  ?Neuro/Psych ?negative neurological ROS ? negative psych ROS  ? GI/Hepatic ?Neg liver ROS, GERD  ,  ?Endo/Other  ?neg diabetesMorbid obesity ? Renal/GU ?negative Renal ROS  ?negative genitourinary ?  ?Musculoskeletal ? ? Abdominal ?  ?Peds ? Hematology ?negative hematology ROS ?(+)   ?Anesthesia Other Findings ?History reviewed. No pertinent past medical history. ? ? Reproductive/Obstetrics ?negative OB ROS ? ?  ? ? ? ? ? ? ? ? ? ? ? ? ? ?  ?  ? ? ? ? ? ? ? ? ?Anesthesia Physical ? ?Anesthesia Plan ? ?ASA: III ? ?Anesthesia Plan: General  ? ?Post-op Pain Management:   ? ?Induction: Intravenous ? ?PONV Risk Score and Plan: 3 and Propofol infusion and TIVA ? ?Airway Management Planned: Natural Airway and Nasal Cannula ? ?Additional Equipment:  ? ?Intra-op Plan:  ? ?Post-operative Plan:  ? ?Informed Consent: I have reviewed the patients History and Physical, chart, labs and discussed the procedure including the risks, benefits and alternatives for the proposed anesthesia with the patient or authorized representative who has indicated his/her understanding and acceptance.  ? ? ? ?Dental Advisory Given ? ?Plan Discussed with: Anesthesiologist, CRNA and Surgeon ? ?Anesthesia Plan  Comments:   ? ? ? ? ? ? ?Anesthesia Quick Evaluation ? ?

## 2021-09-15 NOTE — Anesthesia Postprocedure Evaluation (Signed)
Anesthesia Post Note ? ?Patient: Penny Hardy ? ?Procedure(s) Performed: COLONOSCOPY WITH PROPOFOL ? ?Patient location during evaluation: Endoscopy ?Anesthesia Type: General ?Level of consciousness: awake and alert ?Pain management: pain level controlled ?Vital Signs Assessment: post-procedure vital signs reviewed and stable ?Respiratory status: spontaneous breathing, nonlabored ventilation, respiratory function stable and patient connected to nasal cannula oxygen ?Cardiovascular status: blood pressure returned to baseline and stable ?Postop Assessment: no apparent nausea or vomiting ?Anesthetic complications: no ? ? ?No notable events documented. ? ? ?Last Vitals:  ?Vitals:  ? 09/15/21 1143 09/15/21 1220  ?BP:  95/72  ?Pulse:    ?Resp:    ?Temp: (!) 36.2 ?C   ?SpO2:    ?  ?Last Pain:  ?Vitals:  ? 09/15/21 1220  ?TempSrc:   ?PainSc: 0-No pain  ? ? ?  ?  ?  ?  ?  ?  ? ?[name] ? ? ? ? ?

## 2021-09-15 NOTE — Transfer of Care (Signed)
Immediate Anesthesia Transfer of Care Note ? ?Patient: Penny Hardy ? ?Procedure(s) Performed: COLONOSCOPY WITH PROPOFOL ? ?Patient Location: PACU and Endoscopy Unit ? ?Anesthesia Type:General ? ?Level of Consciousness: drowsy ? ?Airway & Oxygen Therapy: Patient Spontanous Breathing ? ?Post-op Assessment: Report given to RN ? ?Post vital signs: stable ? ?Last Vitals:  ?Vitals Value Taken Time  ?BP    ?Temp    ?Pulse    ?Resp    ?SpO2    ? ? ?Last Pain:  ?Vitals:  ? 09/15/21 1053  ?TempSrc: Temporal  ?PainSc: 0-No pain  ?   ? ?  ? ?Complications: No notable events documented. ?

## 2021-09-15 NOTE — Op Note (Signed)
[facility] ?Gastroenterology ?Patient Name: Penny Hardy ?Procedure Date: 09/15/2021 11:02 AM ?MRN: [id number] ?Account #: [id number] ?Date of Birth: Jun 21, 1981 ?Admit Type: Outpatient ?Age: 41 ?Room: [facility] ENDO ROOM 4 ?Gender: Female ?Note Status: Finalized ?Instrument Name: Colonoscope [id number] ?Procedure:             Colonoscopy ?Indications:           Suspected Crohn's disease of the colon ?Providers:             Lin Landsman MD, MD ?Referring MD:          Neomia Dear (Referring MD) ?Medicines:             General Anesthesia ?Complications:         No immediate complications. Estimated blood loss:  ?                       Minimal. ?Procedure:             Pre-Anesthesia Assessment: ?                       - Prior to the procedure, a History and Physical was  ?                       performed, and patient medications and allergies were  ?                       reviewed. The patient is competent. The risks and  ?                       benefits of the procedure and the sedation options and  ?                       risks were discussed with the patient. All questions  ?                       were answered and informed consent was obtained.  ?                       Patient identification and proposed procedure were  ?                       verified by the physician, the nurse, the  ?                       anesthesiologist, the anesthetist and the technician  ?                       in the pre-procedure area in the procedure room in the  ?                       endoscopy suite. Mental Status Examination: alert and  ?                       oriented. Airway Examination: normal oropharyngeal  ?                       airway and neck mobility. Respiratory Examination:  ?  clear to auscultation. CV Examination: normal.  ?                       Prophylactic Antibiotics: The patient does not require  ?                       prophylactic antibiotics. Prior Anticoagulants: The  ?                        patient has taken no previous anticoagulant or  ?                       antiplatelet agents. ASA Grade Assessment: III - A  ?                       patient with severe systemic disease. After reviewing  ?                       the risks and benefits, the patient was deemed in  ?                       satisfactory condition to undergo the procedure. The  ?                       anesthesia plan was to use general anesthesia.  ?                       Immediately prior to administration of medications,  ?                       the patient was re-assessed for adequacy to receive  ?                       sedatives. The heart rate, respiratory rate, oxygen  ?                       saturations, blood pressure, adequacy of pulmonary  ?                       ventilation, and response to care were monitored  ?                       throughout the procedure. The physical status of the  ?                       patient was re-assessed after the procedure. ?                       After obtaining informed consent, the colonoscope was  ?                       passed under direct vision. Throughout the procedure,  ?                       the patient's blood pressure, pulse, and oxygen  ?                       saturations were monitored continuously. The  ?  Colonoscope was introduced through the anus and  ?                       advanced to the 10 cm into the ileum. The colonoscopy  ?                       was performed without difficulty. The patient  ?                       tolerated the procedure well. The quality of the bowel  ?                       preparation was evaluated using the BBPS [facility] Bowel  ?                       Preparation Scale) with scores of: Right Colon = 3,  ?                       Transverse Colon = 3 and Left Colon = 3 (entire mucosa  ?                       seen well with no residual staining, small fragments  ?                       of stool or opaque liquid).  The total BBPS score  ?                       equals 9. ?Findings: ?     The perianal and digital rectal examinations were normal. Pertinent  ?     negatives include normal sphincter tone and no palpable rectal lesions. ?     Skin tags were found on perianal exam. ?     The terminal ileum appeared normal. Biopsies were taken with a cold  ?     forceps for histology. ?     The Simple Endoscopic Score for Crohn's Disease was determined based on  ?     the endoscopic appearance of the mucosa in the following segments: ?     - Ileum: Findings include no ulcers present, no ulcerated surfaces, no  ?     affected surfaces and no narrowings. Segment score: 0. ?     - Right Colon: Findings include no ulcers present, no ulcerated  ?     surfaces, no affected surfaces and no narrowings. Segment score: 0. ?     - Transverse Colon: Findings include ulcers greater than 2 cm in size,  ?     less than 10% ulcerated surfaces, less than 50% of surfaces affected and  ?     no narrowings. Segment score: 5. ?     - Left Colon: Findings include ulcers greater than 2 cm in size, greater  ?     than 30% ulcerated surfaces, less than 50% of surfaces affected and no  ?     ulcers present, no ulcerated surfaces, no affected surfaces and no  ?     narrowings. Segment score: 7. ?     - Rectum: Findings include no ulcers present, no ulcerated surfaces, no  ?     affected surfaces and no narrowings. Segment score: 0. ?     -  Total SES-CD aggregate score: 12. Biopsies were taken with a cold  ?     forceps for histology. Estimated blood loss was minimal. ?     The retroflexed view of the distal rectum and anal verge was normal and  ?     showed no anal or rectal abnormalities. ?Impression:            - Perianal skin tags found on perianal exam. ?                       - The examined portion of the ileum was normal.  ?                       Biopsied. ?                       - Simple Endoscopic Score for Crohn's Disease: 12,  ?                        mucosal inflammatory changes consistent with Crohn's  ?                       disease. Biopsied. ?                       - The distal rectum and anal verge are normal on  ?                       retroflexion view. ?Recommendation:        - Discharge patient to home (with escort). ?                       - Resume previous diet today. ?                       - Continue present medications. ?                       - Await pathology results. ?                       - Return to my office as previously scheduled. ?Procedure Code(s):     --- Professional --- ?                       [phone], Colonoscopy, flexible; with biopsy, single or  ?                       multiple ?Diagnosis Code(s):     --- Professional --- ?                       K52.9, Noninfective gastroenteritis and colitis,  ?                       unspecified ?                       K64.4, Residual hemorrhoidal skin tags ?CPT copyright 2019 American Medical Association. All rights reserved. ?The codes documented in this report are preliminary and upon coder review may  ?be revised to meet current compliance requirements. ?Dr. Ulyess Mort ?Lin Landsman MD, MD ?09/15/2021  11:38:41 AM ?This report has been signed electronically. ?Number of Addenda: 0 ?Note Initiated On: 09/15/2021 11:02 AM ?Scope Withdrawal Time: 0 hours 15 minutes 27 seconds  ?Total Procedure Duration: 0 hours 18 minutes 32 seconds  ?Estimated Blood Loss:  Estimated blood loss was minimal. ?     [facility] ?

## 2021-09-16 ENCOUNTER — Encounter: Payer: Self-pay | Admitting: Gastroenterology

## 2021-09-19 LAB — SURGICAL PATHOLOGY

## 2021-09-21 ENCOUNTER — Ambulatory Visit: Payer: Medicaid Other | Admitting: Gastroenterology

## 2021-09-21 NOTE — Telephone Encounter (Signed)
Per Penny Hardy was able to fix a glitch with Penny Hardy which held up our paid claim.  This regrettably occurred after we set up shipment with Ms. Penny Hardy.  Penny Hardy spoke to Penny Hardy this morning and her delivery is rescheduled for tomorrow. :) ? ?  ? ?I apologize for this unusual delay in her shipment. ? ?  ?

## 2021-09-26 ENCOUNTER — Telehealth: Payer: Self-pay

## 2021-09-26 NOTE — Telephone Encounter (Signed)
Humira was Delivered to patient on 09/24/21 per [name] with [facility]  ?

## 2021-10-24 ENCOUNTER — Encounter: Payer: Self-pay | Admitting: Gastroenterology

## 2021-10-24 DIAGNOSIS — R079 Chest pain, unspecified: Secondary | ICD-10-CM

## 2021-10-24 NOTE — Telephone Encounter (Signed)
Penny Hardy ? ?Please refer her to cardiology ?Dx: Recurrent chest pain ? ?Please apply for Entyvio as patient cannot tolerate Humira due to side effects ?Dx: Crohn's colitis ? ?RV ?

## 2021-10-24 NOTE — Addendum Note (Signed)
Addended by: Ulyess Blossom L on: 10/24/2021 04:33 PM ? ? Modules accepted: Orders ? ?

## 2021-10-24 NOTE — Telephone Encounter (Signed)
Sent referral to cardiology and also did new start on Entyvio  ?

## 2021-10-25 LAB — COMPREHENSIVE METABOLIC PANEL
ALT: 25 IU/L (ref 0–32)
AST: 17 IU/L (ref 0–40)
Albumin/Globulin Ratio: 1.6 (ref 1.2–2.2)
Albumin: 4.2 g/dL (ref 3.8–4.8)
Alkaline Phosphatase: 87 IU/L (ref 44–121)
BUN/Creatinine Ratio: 21 (ref 9–23)
BUN: 16 mg/dL (ref 6–24)
Bilirubin Total: <0.2 mg/dL (ref 0.0–1.2)
CO2: 24 mmol/L (ref 20–29)
Calcium: 9.3 mg/dL (ref 8.7–10.2)
Chloride: 102 mmol/L (ref 96–106)
Creatinine, Ser: 0.78 mg/dL (ref 0.57–1.00)
Globulin, Total: 2.6 g/dL (ref 1.5–4.5)
Glucose: 95 mg/dL (ref 70–99)
Potassium: 4.5 mmol/L (ref 3.5–5.2)
Sodium: 139 mmol/L (ref 134–144)
Total Protein: 6.8 g/dL (ref 6.0–8.5)
eGFR: 98 mL/min/{1.73_m2} (ref >59)

## 2021-10-25 LAB — IRON,TIBC AND FERRITIN PANEL
Ferritin: 44 ng/mL (ref 15–150)
Iron Saturation: 19 % (ref 15–55)
Iron: 71 ug/dL (ref 27–159)
Total Iron Binding Capacity: 376 ug/dL (ref 250–450)
UIBC: 305 ug/dL (ref 131–425)

## 2021-10-25 LAB — B12 AND FOLATE PANEL
Folate: 8.9 ng/mL (ref >3.0)
Vitamin B-12: 574 pg/mL (ref 232–1245)

## 2021-10-25 LAB — CBC
Hematocrit: 38.9 % (ref 34.0–46.6)
Hemoglobin: 12.9 g/dL (ref 11.1–15.9)
MCH: 29.9 pg (ref 26.6–33.0)
MCHC: 33.2 g/dL (ref 31.5–35.7)
MCV: 90 fL (ref 79–97)
Platelets: 265 10*3/uL (ref 150–450)
RBC: 4.31 x10E6/uL (ref 3.77–5.28)
RDW: 13.1 % (ref 11.7–15.4)
WBC: 6.7 10*3/uL (ref 3.4–10.8)

## 2021-10-25 LAB — C-REACTIVE PROTEIN: CRP: 2 mg/L (ref 0–10)

## 2021-10-31 ENCOUNTER — Telehealth: Payer: Self-pay

## 2021-10-31 NOTE — Telephone Encounter (Signed)
Penny Hardy called with [facility] and states that patient called them this morning and she wants to put the [facility] on hold till she see's her cardiology on 11/22/2021. She states that she also told them that we did not take her symptoms seriously. So they have put the approval process on hold till they find out different  ?

## 2021-11-22 ENCOUNTER — Encounter: Payer: Self-pay | Admitting: Cardiology

## 2021-11-22 ENCOUNTER — Ambulatory Visit (INDEPENDENT_AMBULATORY_CARE_PROVIDER_SITE_OTHER): Payer: Medicaid Other | Admitting: Cardiology

## 2021-11-22 VITALS — BP 110/68 | HR 72 | Ht 62.0 in | Wt 257.0 lb

## 2021-11-22 DIAGNOSIS — R072 Precordial pain: Secondary | ICD-10-CM

## 2021-11-22 NOTE — Progress Notes (Signed)
?Cardiology Office Note:   ? ?Date:  11/22/2021  ? ?ID:  Penny Hardy, DOB July 15, 1980, MRN [id number] ? ?PCP:  Freddy Finner, NP ?  ?[facility] Providers ?Cardiologist:  None    ? ?Referring MD: Lin Landsman, MD  ? ?Chief Complaint  ?Patient presents with  ? New Patient (Initial Visit)  ?  Ref for chest pains after injections for Crohns and colitis. Still has chest pain, stabbing pains.   ? ? ?History of Present Illness:   ? ?Penny Hardy is a 41 y.o. female with a hx of Crohn's disease, obesity who presents due to chest pain.  Patient has left-sided chest pain which began a month ago after taking a second dose of Humira.  She was given Humira to manage her Crohn's disease.  After taking second dose, patient started having sharp chest pain sometimes radiating to her back.  Talk to primary care provider, was advised to stop taking Humira.  She states symptoms have improved although still present.  Symptoms not as severe as prior.  Also endorses left-sided chest pain when she lays on her left side or twists in certain positions.  Denies any personal history of heart disease. ? ?Past Medical History:  ?Diagnosis Date  ? Kidney infection   ? ? ?Past Surgical History:  ?Procedure Laterality Date  ? CESAREAN SECTION    ? COLONOSCOPY WITH PROPOFOL N/A 09/15/2021  ? Procedure: COLONOSCOPY WITH PROPOFOL;  Surgeon: Lin Landsman, MD;  Location: [facility] ENDOSCOPY;  Service: Gastroenterology;  Laterality: N/A;  ? FLEXIBLE SIGMOIDOSCOPY N/A 09/01/2019  ? Procedure: FLEXIBLE SIGMOIDOSCOPY;  Surgeon: Lin Landsman, MD;  Location: [facility] ENDOSCOPY;  Service: Gastroenterology;  Laterality: N/A;  ? ? ?Current Medications: ?No outpatient medications have been marked as taking for the 11/22/21 encounter (Office Visit) with Kate Sable, MD.  ?  ? ?Allergies:   Asa [aspirin], Ibuprofen, and Pollen extract  ? ?Social History  ? ?Socioeconomic History  ? Marital status: Single  ?  Spouse name: Not on file  ?  Number of children: Not on file  ? Years of education: Not on file  ? Highest education level: Not on file  ?Occupational History  ? Not on file  ?Tobacco Use  ? Smoking status: Former  ?  Types: Cigarettes  ? Smokeless tobacco: Never  ?Vaping Use  ? Vaping Use: Never used  ?Substance and Sexual Activity  ? Alcohol use: No  ? Drug use: No  ? Sexual activity: Not Currently  ?Other Topics Concern  ? Not on file  ?Social History Narrative  ? Not on file  ? ?Social Determinants of Health  ? ?Financial Resource Strain: Not on file  ?Food Insecurity: Not on file  ?Transportation Needs: Not on file  ?Physical Activity: Not on file  ?Stress: Not on file  ?Social Connections: Not on file  ?  ? ?Family History: ?The patient's family history is not on file. ? ?ROS:   ?Please see the history of present illness.    ? All other systems reviewed and are negative. ? ?EKGs/Labs/Other Studies Reviewed:   ? ?The following studies were reviewed today: ? ? ?EKG:  EKG is  ordered today.  The ekg ordered today demonstrates normal sinus rhythm, normal ECG ? ?Recent Labs: ?10/24/2021: ALT 25; BUN 16; Creatinine, Ser 0.78; Hemoglobin 12.9; Platelets 265; Potassium 4.5; Sodium 139  ?Recent Lipid Panel ?No results found for: CHOL, TRIG, HDL, CHOLHDL, VLDL, LDLCALC, LDLDIRECT ? ? ?Risk Assessment/Calculations:   ? ? ?    ? ?  Physical Exam:   ? ?VS:  BP 110/68 (BP Location: Right Arm, Patient Position: Sitting, Cuff Size: Large)   Pulse 72   Ht 5' 2"  (1.575 m)   Wt 257 lb (116.6 kg)   SpO2 97%   BMI 47.01 kg/m?    ? ?Wt Readings from Last 3 Encounters:  ?11/22/21 257 lb (116.6 kg)  ?09/15/21 241 lb (109.3 kg)  ?10/25/20 253 lb 6 oz (114.9 kg)  ?  ? ?GEN:  Well nourished, well developed in no acute distress ?HEENT: Normal ?NECK: No JVD; No carotid bruits ?LYMPHATICS: No lymphadenopathy ?CARDIAC: RRR, no murmurs, rubs, gallops ?RESPIRATORY:  Clear to auscultation without rales, wheezing or rhonchi  ?ABDOMEN: Soft, non-tender,  non-distended ?MUSCULOSKELETAL:  No edema; left chest tenderness with palpation ?SKIN: Warm and dry ?NEUROLOGIC:  Alert and oriented x 3 ?PSYCHIATRIC:  Normal affect  ? ?ASSESSMENT:   ? ?1. Precordial pain   ?2. Morbid obesity ([place])   ? ?PLAN:   ? ?In order of problems listed above: ? ?Chest pain, reproducible with palpation.  Patient also had sharp discomfort with taking Humira, which was subsequently stopped.  Humira half-life up to 20 days.  Symptoms seem to be improving.  Get echocardiogram to rule out any structural abnormalities.  Hopefully symptoms fully resolve after 3 half-lives/2 months from last dose. ?Morbid obesity, recommend low-calorie diet, weight loss. ? ?Follow-up after echo ? ?   ? ? ?Medication Adjustments/Labs and Tests Ordered: ?Current medicines are reviewed at length with the patient today.  Concerns regarding medicines are outlined above.  ?Orders Placed This Encounter  ?Procedures  ? EKG 12-Lead  ? ECHOCARDIOGRAM COMPLETE  ? ?No orders of the defined types were placed in this encounter. ? ? ?Patient Instructions  ?Medication Instructions:  ? ?Your physician recommends that you continue on your current medications as directed. Please refer to the Current Medication list given to you today. ? ?*If you need a refill on your cardiac medications before your next appointment, please call your pharmacy* ? ? ?Lab Work: ?None ordered ? ? ?Testing/Procedures: ? ?Your physician has requested that you have an echocardiogram. Echocardiography is a painless test that uses sound waves to create images of your heart. It provides your doctor with information about the size and shape of your heart and how well your heart?s chambers and valves are working. This procedure takes approximately one hour. There are no restrictions for this procedure. ? ? ? ?Follow-Up: ?At [facility], you and your health needs are our priority.  As part of our continuing mission to provide you with exceptional heart care, we  have created designated Provider Care Teams.  These Care Teams include your primary Cardiologist (physician) and Advanced Practice Providers (APPs -  Physician Assistants and Nurse Practitioners) who all work together to provide you with the care you need, when you need it. ? ?We recommend signing up for the patient portal called "MyChart".  Sign up information is provided on this After Visit Summary.  MyChart is used to connect with patients for Virtual Visits (Telemedicine).  Patients are able to view lab/test results, encounter notes, upcoming appointments, etc.  Non-urgent messages can be sent to your provider as well.   ?To learn more about what you can do with MyChart, go to [email or web address].   ? ?Your next appointment:   ?Follow up after Echo  ? ?The format for your next appointment:   ?In Person ? ?Provider:   ?You may see Kate Sable, MD or one of  the following Advanced Practice Providers on your designated Care Team:   ?Murray Hodgkins, NP ?Christell Faith, PA-C ?Cadence Kathlen Mody, PA-C  ? ? ? ?Important Information About Sugar ? ? ? ? ? ?  ? ?Signed, ?Kate Sable, MD  ?11/22/2021 9:51 AM    ?[place] ?

## 2021-11-22 NOTE — Patient Instructions (Signed)
Medication Instructions:  ? ?Your physician recommends that you continue on your current medications as directed. Please refer to the Current Medication list given to you today. ? ?*If you need a refill on your cardiac medications before your next appointment, please call your pharmacy* ? ? ?Lab Work: ?None ordered ? ? ?Testing/Procedures: ? ?Your physician has requested that you have an echocardiogram. Echocardiography is a painless test that uses sound waves to create images of your heart. It provides your doctor with information about the size and shape of your heart and how well your heart?s chambers and valves are working. This procedure takes approximately one hour. There are no restrictions for this procedure. ? ? ? ?Follow-Up: ?At [facility], you and your health needs are our priority.  As part of our continuing mission to provide you with exceptional heart care, we have created designated Provider Care Teams.  These Care Teams include your primary Cardiologist (physician) and Advanced Practice Providers (APPs -  Physician Assistants and Nurse Practitioners) who all work together to provide you with the care you need, when you need it. ? ?We recommend signing up for the patient portal called "MyChart".  Sign up information is provided on this After Visit Summary.  MyChart is used to connect with patients for Virtual Visits (Telemedicine).  Patients are able to view lab/test results, encounter notes, upcoming appointments, etc.  Non-urgent messages can be sent to your provider as well.   ?To learn more about what you can do with MyChart, go to [email or web address].   ? ?Your next appointment:   ?Follow up after Echo  ? ?The format for your next appointment:   ?In Person ? ?Provider:   ?You may see Kate Sable, MD or one of the following Advanced Practice Providers on your designated Care Team:   ?Murray Hodgkins, NP ?Christell Faith, PA-C ?Cadence Kathlen Mody, PA-C  ? ? ? ?Important Information About  [place] ? ? ? ? ? ? ?

## 2021-11-22 NOTE — Telephone Encounter (Signed)
Called and left a message for call back to make appointment  ?

## 2021-11-29 ENCOUNTER — Encounter: Payer: Self-pay | Admitting: Gastroenterology

## 2021-11-29 ENCOUNTER — Ambulatory Visit (INDEPENDENT_AMBULATORY_CARE_PROVIDER_SITE_OTHER): Payer: Medicaid Other | Admitting: Gastroenterology

## 2021-11-29 VITALS — BP 124/84 | HR 101 | Temp 98.4°F | Ht 62.0 in | Wt 257.2 lb

## 2021-11-29 DIAGNOSIS — K50111 Crohn's disease of large intestine with rectal bleeding: Secondary | ICD-10-CM

## 2021-11-29 MED ORDER — PREDNISONE 10 MG PO TABS: ORAL_TABLET | ORAL | 0 refills | Status: AC

## 2021-11-29 NOTE — Progress Notes (Signed)
Cephas Darby, MD [address]  [place]  [place], [place] [address]  Main: [phone]  Fax: [phone]    Gastroenterology Consultation  Referring Provider:     Freddy Finner, NP Primary Care Physician:  Freddy Finner, NP Primary Gastroenterologist:  Dr. Alice Reichert Reason for Consultation: Crohn's colitis        HPI:   Penny Hardy is a 41 y.o. female referred by Dr. Freddy Finner, NP  for consultation & management of Crohn's colitis.  Patient is being managed as indeterminate colitis for last 2 years, symptoms always responding to prednisone, eventually had a colonoscopy on 09/15/2021 which revealed moderately severe patchy involvement of the transverse colon as well as left colon and biopsies confirmed moderate to severe colitis of the transverse colon and sigmoid colon, mild chronic active colitis of the cecum, ascending colon, descending colon and rectum.  Patient responded to prednisone again.  I started her on Humira, apparently after taking third dose, patient started experiencing chest pain radiating to the back between shoulder blades, felt like needles sensation, twisting in the upper spine.  She was referred to cardiology for further evaluation, twelve-lead EKG was negative, echocardiogram is pending.  Patient attributed her symptoms to Humira because she did not experience these before and she therefore self discontinued Humira after discussing with [facility] pharmacist.  Patient reports that her chest pain and pain in between the shoulder blades is improving.  Her Weyman Rodney has been approved but she is not willing to start Entyvio until cardiac work-up is completed  Patient reports that within the last 1 to 2 weeks, she has been experiencing severe rectal pressure, bloody discharge as well as loose stools.  She wants to know if she can take another course of prednisone while awaiting cardiac work-up.  NSAIDs: None  Antiplts/Anticoagulants/Anti thrombotics: None  GI  Procedures:  Colonoscopy 09/15/2021 - Perianal skin tags found on perianal exam. - The examined portion of the ileum was normal. Biopsied. - Simple Endoscopic Score for Crohn's Disease: 12, mucosal inflammatory changes consistent with Crohn's disease. Biopsied. - The distal rectum and anal verge are normal on retroflexion view.  DIAGNOSIS:  A.  TERMINAL ILEUM; COLD BIOPSY:  - UNREMARKABLE SMALL INTESTINAL MUCOSA.  - NEGATIVE FOR DYSPLASIA AND MALIGNANCY.   B.  COLON, CECUM AND ASCENDING; COLD BIOPSY:  - VERY FOCAL AND MILD CHRONIC ACTIVE COLITIS INVOLVING A MINORITY OF THE  BIOPSY FRAGMENTS.  - NEGATIVE FOR DYSPLASIA AND MALIGNANCY.   C.  COLON, TRANSVERSE; COLD BIOPSY:  - MODERATE TO MARKED CHRONIC ACTIVE COLITIS.  - IHC FOR CMV WILL BE REPORTED IN AN ADDENDUM.  - NEGATIVE FOR DYSPLASIA AND MALIGNANCY.   D.  COLON, DESCENDING; COLD BIOPSY:  - VERY FOCAL AND MILD CHRONIC ACTIVE COLITIS INVOLVING A MINORITY OF THE  BIOPSY FRAGMENTS.  - NEGATIVE FOR DYSPLASIA AND MALIGNANCY.   E.  COLON, SIGMOID; COLD BIOPSY:  - MODERATE TO MARKED CHRONIC ACTIVE COLITIS.  - IHC FOR CMV WILL BE REPORTED IN AN ADDENDUM.  - NEGATIVE FOR DYSPLASIA AND MALIGNANCY.   F.  RECTUM; COLD BIOPSY:  - PATCHY MILD ACTIVE PROCTITIS.  - NEGATIVE FOR FEATURES OF CHRONICITY, DYSPLASIA, AND MALIGNANCY.   Flexible sigmoidoscopy 09/01/2019 - Preparation of the colon was fair. - Perianal skin tags found on perianal exam. - Crohn's disease with colonic involvement. Inflammation was found in the rectum, in the sigmoid colon and in the descending colon. This was mild in severity. Biopsied. DIAGNOSIS:  A.  COLON, RANDOM  LEFT; COLD BIOPSY:  - COLONIC MUCOSA WITH FOCAL AND MILD EDEMA.  - NEGATIVE FOR MICROSCOPIC COLITIS, FEATURES OF CHRONICITY, DYSPLASIA,  AND MALIGNANCY.   B.  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  -  NEGATIVE FOR FEATURES OF CHRONICITY, DYSPLASIA, AND MALIGNANCY  EGD 09/23/2018 The examined esophagus was normal Patchy minimal inflammation characterized by erythema was found in the gastric body and in the gastric antrum, biopsies were performed.  Normal cardia and gastric fundus on retroflexion.  The examined duodenum was normal.  Biopsies were performed to evaluate for celiac disease. Pathology Duodenal biopsy revealed duodenal mucosa with focal gastric metaplasia, no other abnormalities.  Stomach biopsy: Mild to moderate chronic gastritis, negative for Helicobacter pylori  Colonoscopy 09/23/2018 The perianal and digital rectal exam were normal.  A small fistula was found in the distal rectum.  Nonbleeding internal hemorrhoids on retroflexion.  An area of moderately congested mucosa was found in the rectum, sigmoid colon and the descending colon and in the ascending colon.  Biopsies were taken for histology.  Terminal ileum was normal.  Biopsies were taken.  The transverse colon and cecum appeared normal.  Biopsies were taken.  Few small mouth diverticula were found in the sigmoid colon Pathology terminal ileum: Ileal mucosa with no diagnostic abnormalities, no evidence of inflammatory bowel disease.  Cecum: No evidence of active inflammatory bowel disease, colonic mucosa with no diagnostic abnormalities, negative for dysplasia Colon biopsy, ascending colon: Mucosa with architectural distortion compatible with prior mucosal damage.  No evidence of active idiopathic inflammatory bowel disease.  Colon biopsy, transverse colon: Mucosa with no diagnostic abnormalities, no evidence of active inflammatory bowel disease.  Colon biopsy descending colon: Focal active colitis, negative for dysplasia or malignancy.  Colon biopsy sigmoid colon and rectum: Focal active cryptitis, negative for dysplasia or malignancy    Past Medical History:  Diagnosis Date   Kidney infection     Past Surgical History:   Procedure Laterality Date   CESAREAN SECTION     COLONOSCOPY WITH PROPOFOL N/A 09/15/2021   Procedure: COLONOSCOPY WITH PROPOFOL;  Surgeon: Lin Landsman, MD;  Location: [facility] ENDOSCOPY;  Service: Gastroenterology;  Laterality: N/A;   FLEXIBLE SIGMOIDOSCOPY N/A 09/01/2019   Procedure: FLEXIBLE SIGMOIDOSCOPY;  Surgeon: Lin Landsman, MD;  Location: [facility] ENDOSCOPY;  Service: Gastroenterology;  Laterality: N/A;   No current outpatient medications on file.   No family history on file.   Social History   Tobacco Use   Smoking status: Former    Types: Cigarettes   Smokeless tobacco: Never  Vaping Use   Vaping Use: Never used  Substance Use Topics   Alcohol use: No   Drug use: No    Allergies as of 11/29/2021 - Review Complete 11/29/2021  Allergen Reaction Noted   Asa [aspirin] Hives and Nausea And Vomiting 12/17/2014   Ibuprofen Hives and Nausea And Vomiting 10/01/2018   Pollen extract Other (See Comments) 04/24/2013    Review of Systems:    All systems reviewed and negative except where noted in HPI.   Physical Exam:  BP 124/84 (BP Location: Left Arm, Patient Position: Sitting, Cuff Size: Large)   Pulse (!) 101   Temp 98.4 F (36.9 C) (Oral)   Ht 5' 2"  (1.575 m)   Wt 257 lb 4 oz (116.7 kg)   BMI 47.05 kg/m  No LMP recorded. (Menstrual status: IUD).  General:   Alert,  Well-developed,  well-nourished, pleasant and cooperative in moderate distress due to anal pain Head:  Normocephalic and atraumatic. Eyes:  Sclera clear, no icterus.   Conjunctiva pink. Ears:  Normal auditory acuity. Nose:  No deformity, discharge, or lesions. Mouth:  No deformity or lesions,oropharynx pink & moist. Neck:  Supple; no masses or thyromegaly. Lungs:  Respirations even and unlabored.  Clear throughout to auscultation.   No wheezes, crackles, or rhonchi. No acute distress. Heart:  Regular rate and rhythm; no murmurs, clicks, rubs, or gallops. Abdomen:  Normal bowel sounds. Soft,  morbidly obese, non-tender and non-distended without masses,  No guarding or rebound tenderness.   Rectal: Not performed Msk:  Symmetrical without gross deformities. Good, equal movement & strength bilaterally. Pulses:  Normal pulses noted. Extremities:  No clubbing or edema.  No cyanosis. Neurologic:  Alert and oriented x3;  grossly normal neurologically. Skin:  Intact without significant lesions or rashes. No jaundice. Psych:  Alert and cooperative. Normal mood and affect.  Imaging Studies: Reviewed  Assessment and Plan:   Penny Hardy is a 41 y.o. female with BMI 45, chronic symptoms of oral ulcers, diarrhea with rectal bleeding, rectal fistula, responding to pulse steroid courses, history of recurrent C. difficile infection, history of norovirus, being managed as an indeterminate colitis for last 2 years eventually diagnosed with Crohn's colitis based on the colonoscopy in 09/2021.  Patient did not tolerate Humira well due to new onset of chest pain and upper back pain as well as neck pain and she self discontinued Humira after 3 doses.  Weyman Rodney has been approved but patient would like to wait until Cardiac work-up is completed.  Patient is currently experiencing rectal pressure with bloody drainage per rectum as well as loose stools.  Patient felt completely normal while she was on Humira and she was in the state of health.  Unfortunately, she cannot continue Humira due to side effects  Trial of short prednisone course Today, I have also discussed regarding Rinvoq which has been just approved for Crohn's disease and patient is willing to try since it is an oral molecule.  We will apply to her insurance  Follow up in 3 to 4 months  Cephas Darby, MD

## 2021-12-27 ENCOUNTER — Encounter: Payer: Self-pay | Admitting: *Deleted

## 2021-12-27 ENCOUNTER — Telehealth: Payer: Self-pay

## 2021-12-27 ENCOUNTER — Ambulatory Visit (INDEPENDENT_AMBULATORY_CARE_PROVIDER_SITE_OTHER): Payer: Medicaid Other

## 2021-12-27 DIAGNOSIS — R072 Precordial pain: Secondary | ICD-10-CM | POA: Diagnosis not present

## 2021-12-27 LAB — ECHOCARDIOGRAM COMPLETE
AR max vel: 1.57 cm2
AV Area VTI: 1.52 cm2
AV Area mean vel: 1.48 cm2
AV Mean grad: 3 mmHg
AV Peak grad: 4.4 mmHg
Ao pk vel: 1.05 m/s
Area-P 1/2: 3.91 cm2
Calc EF: 64.8 %
S' Lateral: 3.3 cm
Single Plane A2C EF: 66.5 %
Single Plane A4C EF: 65.2 %

## 2021-12-27 NOTE — Telephone Encounter (Signed)
Patient is calling because she states she needs to taper down to 15m of the Prednisone. Informed patient no she stops the Prednisone after she finishes the 7 days of the 146m I told her that is how Dr. VaMarius Ditchoes the Taper. She verbalized understanding

## 2022-01-02 ENCOUNTER — Emergency Department: Payer: Medicaid Other

## 2022-01-02 ENCOUNTER — Emergency Department
Admission: EM | Admit: 2022-01-02 | Discharge: 2022-01-02 | Disposition: A | Discharge status: Home / Self Care | Payer: Medicaid Other | Attending: Emergency Medicine | Admitting: Emergency Medicine

## 2022-01-02 DIAGNOSIS — R300 Dysuria: Secondary | ICD-10-CM | POA: Insufficient documentation

## 2022-01-02 DIAGNOSIS — R1032 Left lower quadrant pain: Secondary | ICD-10-CM | POA: Diagnosis present

## 2022-01-02 DIAGNOSIS — K5792 Diverticulitis of intestine, part unspecified, without perforation or abscess without bleeding: Secondary | ICD-10-CM | POA: Diagnosis not present

## 2022-01-02 LAB — CBC
HCT: 38.6 % (ref 36.0–46.0)
Hemoglobin: 12.1 g/dL (ref 12.0–15.0)
MCH: 28.7 pg (ref 26.0–34.0)
MCHC: 31.3 g/dL (ref 30.0–36.0)
MCV: 91.7 fL (ref 80.0–100.0)
Platelets: 403 10*3/uL — ABNORMAL HIGH (ref 150–400)
RBC: 4.21 MIL/uL (ref 3.87–5.11)
RDW: 11.9 % (ref 11.5–15.5)
WBC: 9.3 10*3/uL (ref 4.0–10.5)
nRBC: 0 % (ref 0.0–0.2)

## 2022-01-02 LAB — URINALYSIS, ROUTINE W REFLEX MICROSCOPIC
Bilirubin Urine: NEGATIVE
Glucose, UA: NEGATIVE mg/dL
Hgb urine dipstick: NEGATIVE
Ketones, ur: NEGATIVE mg/dL
Nitrite: NEGATIVE
Protein, ur: NEGATIVE mg/dL
Specific Gravity, Urine: 1.015 (ref 1.005–1.030)
pH: 7 (ref 5.0–8.0)

## 2022-01-02 LAB — BASIC METABOLIC PANEL
Anion gap: 5 (ref 5–15)
BUN: 10 mg/dL (ref 6–20)
CO2: 28 mmol/L (ref 22–32)
Calcium: 8.5 mg/dL — ABNORMAL LOW (ref 8.9–10.3)
Chloride: 105 mmol/L (ref 98–111)
Creatinine, Ser: 0.89 mg/dL (ref 0.44–1.00)
GFR, Estimated: >60 mL/min (ref >60)
Glucose, Bld: 101 mg/dL — ABNORMAL HIGH (ref 70–99)
Potassium: 4.1 mmol/L (ref 3.5–5.1)
Sodium: 138 mmol/L (ref 135–145)

## 2022-01-02 LAB — POC URINE PREG, ED: Preg Test, Ur: NEGATIVE

## 2022-01-02 MED ORDER — METRONIDAZOLE 500 MG PO TABS
500.0000 mg | ORAL_TABLET | Freq: Once | ORAL | Status: AC
  Administered 2022-01-02: 500 mg via ORAL
  Filled 2022-01-02: qty 1

## 2022-01-02 MED ORDER — METRONIDAZOLE 500 MG PO TABS: 500.0000 mg | ORAL_TABLET | Freq: Two times a day (BID) | ORAL | 0 refills | Status: DC

## 2022-01-02 MED ORDER — FLUCONAZOLE 150 MG PO TABS: 150.0000 mg | ORAL_TABLET | Freq: Every day | ORAL | 0 refills | Status: DC

## 2022-01-02 MED ORDER — CIPROFLOXACIN HCL 500 MG PO TABS: 500.0000 mg | ORAL_TABLET | Freq: Two times a day (BID) | ORAL | 0 refills | Status: AC

## 2022-01-02 MED ORDER — CIPROFLOXACIN HCL 500 MG PO TABS
500.0000 mg | ORAL_TABLET | Freq: Once | ORAL | Status: AC
  Administered 2022-01-02: 500 mg via ORAL
  Filled 2022-01-02: qty 1

## 2022-01-02 NOTE — ED Provider Notes (Signed)
[facility] Provider Note  Patient Contact: 8:39 PM (approximate)   History   Flank Pain   HPI  Penny Hardy is a 41 y.o. female who presents the emergency department complaining of left lower quadrant abdominal pain, ongoing dysuria.  Patient was seen 5 days ago at urgent care, diagnosed with a UTI and placed on nitrofurantoin.  Patient states that she has had no significant improvement of her symptoms.  She does not have any fevers, chills, nausea, vomiting, diarrhea or constipation but is started to develop sharp/severe left lower quadrant abdominal pain.  She does have a history of Crohn's, does have frequent abdominal pain but states that this is different than normal.  No vaginal bleeding or discharge.     Physical Exam   Triage Vital Signs: ED Triage Vitals [01/02/22 1939]  Enc Vitals Group     BP (!) 115/55     Pulse Rate (!) 105     Resp 18     Temp 98.3 F (36.8 C)     Temp Source Oral     SpO2 98 %     Weight      Height      Head Circumference      Peak Flow      Pain Score 10     Pain Loc      Pain Edu?      Excl. in GC?     Most recent vital signs: Vitals:   01/02/22 1939  BP: (!) 115/55  Pulse: (!) 105  Resp: 18  Temp: 98.3 F (36.8 C)  SpO2: 98%     General: Alert and in no acute distress.   Cardiovascular:  Good peripheral perfusion Respiratory: Normal respiratory effort without tachypnea or retractions. Lungs CTAB. Good air entry to the bases with no decreased or absent breath sounds. Gastrointestinal: Bowel sounds 4 quadrants.  Soft to palpation all quadrants.  Patient is tender in the left lower quadrant. She is slightly tender to palpation in the suprapubic region. No other significant tenderness to palpation.  . No guarding or rigidity. No palpable masses. No distention. No CVA tenderness Musculoskeletal: Full range of motion to all extremities.  Neurologic:  No gross focal neurologic deficits are appreciated.   Skin:   No rash noted Other:   ED Results / Procedures / Treatments   Labs (all labs ordered are listed, but only abnormal results are displayed) Labs Reviewed  URINALYSIS, ROUTINE W REFLEX MICROSCOPIC - Abnormal; Notable for the following components:      Result Value   Color, Urine YELLOW (*)    APPearance HAZY (*)    Leukocytes,Ua MODERATE (*)    Bacteria, UA MANY (*)    All other components within normal limits  CBC - Abnormal; Notable for the following components:   Platelets 403 (*)    All other components within normal limits  BASIC METABOLIC PANEL - Abnormal; Notable for the following components:   Glucose, Bld 101 (*)    Calcium 8.5 (*)    All other components within normal limits  POC URINE PREG, ED     EKG     RADIOLOGY  I personally viewed, evaluated, and interpreted these images as part of my medical decision making, as well as reviewing the written report by the radiologist.  ED Provider Interpretation: Evaluation of CT scan reveals findings consistent with diverticulitis along the left/descending colon.  No evidence of perforation or free fluid.  No evidence of  pyelonephritis, hydroureter, hydronephrosis, nephrolithiasis  CT ABDOMEN PELVIS WO CONTRAST  Result Date: 01/02/2022 CLINICAL DATA:  Bilateral flank pain, left lower quadrant pain. No improvement with antibiotics given at urgent care 5 days ago. Persistent chills and fatigue. EXAM: CT ABDOMEN AND PELVIS WITHOUT CONTRAST TECHNIQUE: Multidetector CT imaging of the abdomen and pelvis was performed following the standard protocol without IV contrast. RADIATION DOSE REDUCTION: This exam was performed according to the departmental dose-optimization program which includes automated exposure control, adjustment of the mA and/or kV according to patient size and/or use of iterative reconstruction technique. COMPARISON:  CT with IV contrast 07/25/2019 FINDINGS: Lower chest: No acute abnormality. Linear scarring  again noted lingular base. Hepatobiliary: 21.5 cm in length moderately steatotic liver again noted. No mass is seen without contrast. 3.9 cm large single stone again noted in the gallbladder but no wall thickening or biliary dilatation. Pancreas: Unremarkable without contrast. Spleen: Unremarkable without contrast.  No splenomegaly. Adrenals/Urinary Tract: There is no adrenal mass, no focal renal cortical abnormality without contrast is seen. There is no urinary stone or obstruction. There is no bladder thickening. Stomach/Bowel: The stomach, unopacified small bowel and appendix are unremarkable. There is moderate stool retention in the ascending and transverse colon. There are diverticula along the descending colon. There is an 8.1 cm length segment of the proximal descending colon with wall thickening and mild inflammatory stranding. In the distal third of the descending colon there is a second separate 6.4 mm length of the descending colon with moderate wall thickening and moderate inflammatory reaction around it. Findings are consistent with 2 sites of acute diverticulitis in the descending colon. The sigmoid and rectum are unremarkable. There is no evidence of diverticular abscess or perforation. Vascular/Lymphatic: There tiny reactive mesenteric lymph nodes alongside the areas of diverticulitis described above, but no bulky or encasing adenopathy or enlarged lymph nodes by short axis size criteria. No significant vascular findings. Reproductive: The uterus is intact. Interval removal IUD. The ovaries are not enlarged. Other: There is a small umbilical fat hernia with increased edema in the herniated fat compared to the prior study. There is no herniated bowel. There are small inguinal fat hernias. There is no free air, hemorrhage or fluid with the exception of trace reactive fluid settling along the posterolateral distal left paracolic gutter. Musculoskeletal: No acute skeletal abnormality. There is symmetric  chronic nonerosive bilateral sacroiliitis. IMPRESSION: 1. Two separate proximal and distal lengths of the descending colon showing findings compatible with acute diverticulitis. No abscess or free air is seen. Colonoscopy follow-up recommended after treatment to exclude underlying lesion. 2. Hepatic steatosis. 3. Cholelithiasis. 4. Increasingly edematous umbilical fat hernia. No incarcerated bowel. 5. Chronic symmetric sacroiliitis. Electronically Signed   By: Almira Bar M.D.   On: 01/02/2022 22:01    PROCEDURES:  Critical Care performed: No  Procedures   MEDICATIONS ORDERED IN ED: Medications  ciprofloxacin (CIPRO) tablet 500 mg (has no administration in time range)  metroNIDAZOLE (FLAGYL) tablet 500 mg (has no administration in time range)     IMPRESSION / MDM / ASSESSMENT AND PLAN / ED COURSE  I reviewed the triage vital signs and the nursing notes.                              Differential diagnosis includes, but is not limited to, Crohn's flare, ulcerative colitis flare, diverticulitis, UTI, pyelonephritis, nephrolithiasis  Patient's presentation is most consistent with acute presentation with potential threat to  life or bodily function.   Patient's diagnosis is consistent with diverticulitis.  Patient presents with left lower quadrant abdominal pain.  Patient had recently been treated for UTI with no improvement of symptoms.  She does have an extensive GI history with ulcerative colitis and Crohn's.  States that she has tried antibiotics for UTI, steroids for Crohn's with no improvement.  Imaging and labs were undertaken.  Patient is a reassuring white blood cell count but CT reveals findings consistent with diverticulitis without evidence of perforation.  She will be placed on Flagyl and Cipro at this time.  Take probiotics while on the antibiotics.  Follow-up with primary care or GI as needed.  Return precautions discussed with the patient..  Patient is given ED precautions to  return to the ED for any worsening or new symptoms.        FINAL CLINICAL IMPRESSION(S) / ED DIAGNOSES   Final diagnoses:  Diverticulitis     Rx / DC Orders   ED Discharge Orders          Ordered    ciprofloxacin (CIPRO) 500 MG tablet  2 times daily        01/02/22 2249    metroNIDAZOLE (FLAGYL) 500 MG tablet  2 times daily        01/02/22 2249    fluconazole (DIFLUCAN) 150 MG tablet  Daily        01/02/22 2249             Note:  This document was prepared using Dragon voice recognition software and may include unintentional dictation errors.   Lanette Hampshire 01/02/22 2252    Gilles Chiquito, MD 01/02/22 [phone]

## 2022-01-03 ENCOUNTER — Encounter: Payer: Self-pay | Admitting: Gastroenterology

## 2022-01-03 ENCOUNTER — Telehealth: Payer: Self-pay | Admitting: Gastroenterology

## 2022-01-03 NOTE — Telephone Encounter (Signed)
Left vm for Penny Hardy to get a call back

## 2022-01-03 NOTE — Telephone Encounter (Signed)
Patient states that last week she went to urgent care because she was having left side abdominal pain. Urgent care did a urinalysis and told her she had blood in her urine and she had a kidney infection. They gave her antibiotics and yesterday she called urgent care back because she was not filling any better. They told her she needed to go to the ER because she could be in kidney failure. She went to the ER and they did a CT scan and diagnosis her with diverticulitis. They gave her Cipro and Flagyl. The ER doctor recommended her to call her GI doctor and let them know because they might want to do a colonoscopy so see how far the diverticulitis went on the wall of her colon.   She states the Rinvoq nurse just called to set up the shipment of her Rinvoq and she told them she is not taking this medication at this time because there is to many other stuff going on with her.

## 2022-01-03 NOTE — Telephone Encounter (Signed)
Penny Hardy  I sent her a MyChart message in detail, no need of a colonoscopy.  I really want her to start Rinvoq.   RV

## 2022-01-12 ENCOUNTER — Encounter: Payer: Self-pay | Admitting: Cardiology

## 2022-01-12 ENCOUNTER — Ambulatory Visit (INDEPENDENT_AMBULATORY_CARE_PROVIDER_SITE_OTHER): Payer: Medicaid Other | Admitting: Cardiology

## 2022-01-12 VITALS — BP 100/70 | HR 76 | Ht 62.0 in | Wt 257.1 lb

## 2022-01-12 DIAGNOSIS — R072 Precordial pain: Secondary | ICD-10-CM

## 2022-01-12 NOTE — Progress Notes (Signed)
Cardiology Office Note:    Date:  01/12/2022   ID:  Penny Hardy, DOB 07-Jan-1981, MRN [id number]  PCP:  Freddy Finner, NP   [facility] Providers Cardiologist:  None     Referring MD: Freddy Finner, NP   Chief Complaint  Patient presents with   Follow up Echo    Patient c/o chest pain that feels like a "shock" Medications reviewed by the patient verbally.     History of Present Illness:    Penny Hardy is a 41 y.o. female with a hx of Crohn's disease, obesity who presents for follow-up.  Previously seen due to chest pain.  Symptoms of chest pain occurring in the setting of taking Humira.  Echocardiogram was obtained to evaluate cardiac function.  Symptoms were not consistent with angina.  She states her chest pain has improved since stopping Humira, although still present.  Otherwise doing okay.  Past Medical History:  Diagnosis Date   Kidney infection     Past Surgical History:  Procedure Laterality Date   CESAREAN SECTION     COLONOSCOPY WITH PROPOFOL N/A 09/15/2021   Procedure: COLONOSCOPY WITH PROPOFOL;  Surgeon: Lin Landsman, MD;  Location: [facility] ENDOSCOPY;  Service: Gastroenterology;  Laterality: N/A;   FLEXIBLE SIGMOIDOSCOPY N/A 09/01/2019   Procedure: FLEXIBLE SIGMOIDOSCOPY;  Surgeon: Lin Landsman, MD;  Location: [facility] ENDOSCOPY;  Service: Gastroenterology;  Laterality: N/A;    Current Medications: No outpatient medications have been marked as taking for the 01/12/22 encounter (Office Visit) with Kate Sable, MD.     Allergies:   Asa [aspirin], Ibuprofen, and Pollen extract   Social History   Socioeconomic History   Marital status: Single    Spouse name: Not on file   Number of children: Not on file   Years of education: Not on file   Highest education level: Not on file  Occupational History   Not on file  Tobacco Use   Smoking status: Former    Types: Cigarettes   Smokeless tobacco: Never  Vaping Use   Vaping Use: Never used   Substance and Sexual Activity   Alcohol use: No   Drug use: No   Sexual activity: Not Currently  Other Topics Concern   Not on file  Social History Narrative   Not on file   Social Determinants of Health   Financial Resource Strain: Not on file  Food Insecurity: Not on file  Transportation Needs: Not on file  Physical Activity: Not on file  Stress: Not on file  Social Connections: Not on file     Family History: The patient's family history is not on file.  ROS:   Please see the history of present illness.     All other systems reviewed and are negative.  EKGs/Labs/Other Studies Reviewed:    The following studies were reviewed today:   EKG:  EKG is  ordered today.  The ekg ordered today demonstrates normal sinus rhythm, PACs  Recent Labs: 10/24/2021: ALT 25 01/02/2022: BUN 10; Creatinine, Ser 0.89; Hemoglobin 12.1; Platelets 403; Potassium 4.1; Sodium 138  Recent Lipid Panel No results found for: "CHOL", "TRIG", "HDL", "CHOLHDL", "VLDL", "LDLCALC", "LDLDIRECT"   Risk Assessment/Calculations:          Physical Exam:    VS:  BP 100/70 (BP Location: Left Arm, Patient Position: Sitting, Cuff Size: Large)   Pulse 76   Ht 5' 2"  (1.575 m)   Wt 257 lb 2 oz (116.6 kg)   SpO2 99%  BMI 47.03 kg/m     Wt Readings from Last 3 Encounters:  01/12/22 257 lb 2 oz (116.6 kg)  11/29/21 257 lb 4 oz (116.7 kg)  11/22/21 257 lb (116.6 kg)     GEN:  Well nourished, well developed in no acute distress HEENT: Normal NECK: No JVD; No carotid bruits LYMPHATICS: No lymphadenopathy CARDIAC: RRR, no murmurs, rubs, gallops RESPIRATORY:  Clear to auscultation without rales, wheezing or rhonchi  ABDOMEN: Soft, non-tender, non-distended MUSCULOSKELETAL:  No edema; left chest tenderness with palpation SKIN: Warm and dry NEUROLOGIC:  Alert and oriented x 3 PSYCHIATRIC:  Normal affect   ASSESSMENT:    1. Precordial pain   2. Morbid obesity ([place])    PLAN:    In order of  problems listed above:  Chest pain, occasional sharp, appears atypical.  Echocardiogram with normal systolic and diastolic function, EF 55 to 60%.  Unsure if Humira was contributing to symptoms.  Get [employer]. Morbid obesity, recommend low-calorie diet, weight loss.  Follow-up stress testing     Shared Decision Making/Informed Consent The risks [chest pain, shortness of breath, cardiac arrhythmias, dizziness, blood pressure fluctuations, myocardial infarction, stroke/transient ischemic attack, nausea, vomiting, allergic reaction, radiation exposure, metallic taste sensation and life-threatening complications (estimated to be 1 in 10,000)], benefits (risk stratification, diagnosing coronary artery disease, treatment guidance) and alternatives of a nuclear stress test were discussed in detail with Penny Hardy and she agrees to proceed.    Medication Adjustments/Labs and Tests Ordered: Current medicines are reviewed at length with the patient today.  Concerns regarding medicines are outlined above.  Orders Placed This Encounter  Procedures   NM Myocar Multi W/Spect W/Wall Motion / EF   EKG 12-Lead   No orders of the defined types were placed in this encounter.   Patient Instructions  Medication Instructions:   Your physician recommends that you continue on your current medications as directed. Please refer to the Current Medication list given to you today.  *If you need a refill on your cardiac medications before your next appointment, please call your pharmacy*   Testing/Procedures:  [place]     Your caregiver has ordered a Stress Test with nuclear imaging. The purpose of this test is to evaluate the blood supply to your heart muscle. This procedure is referred to as a "Non-Invasive Stress Test." This is because other than having an IV started in your vein, nothing is inserted or "invades" your body. Cardiac stress tests are done to find areas of poor blood flow to the  heart by determining the extent of coronary artery disease (CAD). Some patients exercise on a treadmill, which naturally increases the blood flow to your heart, while others who are  unable to walk on a treadmill due to physical limitations have a pharmacologic/chemical stress agent called Lexiscan . This medicine will mimic walking on a treadmill by temporarily increasing your coronary blood flow.      PLEASE REPORT TO [facility] MEDICAL MALL ENTRANCE   THE VOLUNTEERS AT THE FIRST DESK WILL DIRECT YOU WHERE TO GO     *Please note: these test may take anywhere between 2-4 hours to complete       Date of Procedure:_____________________________________   Arrival Time for Procedure:______________________________    PLEASE NOTIFY THE OFFICE AT LEAST 24 HOURS IN ADVANCE IF YOU ARE UNABLE TO KEEP YOUR APPOINTMENT.  [place] 24 HOURS IN ADVANCE IF YOU ARE UNABLE TO KEEP YOUR APPOINTMENT.  [phone]      How to prepare for your Myoview test:        1. Do not eat or drink after midnight  2. No caffeine for 24 hours prior to test  3. No smoking 24 hours prior to test.  4. Unless instructed otherwise, Take your medication with a small sips of water.    5.         Ladies, please do not wear dresses. Skirts or pants are appropriate. Please wear a short sleeve shirt.  6. No perfume, cologne or lotion.  7. Wear comfortable walking shoes. No heels!     Follow-Up: At [facility], you and your health needs are our priority.  As part of our continuing mission to provide you with exceptional heart care, we have created designated Provider Care Teams.  These Care Teams include your primary Cardiologist (physician) and Advanced Practice Providers (APPs -  Physician Assistants and Nurse Practitioners) who all work together to provide you with the care you need, when you need it.  We recommend signing up for the patient portal called "MyChart".  Sign up  information is provided on this After Visit Summary.  MyChart is used to connect with patients for Virtual Visits (Telemedicine).  Patients are able to view lab/test results, encounter notes, upcoming appointments, etc.  Non-urgent messages can be sent to your provider as well.   To learn more about what you can do with MyChart, go to [email or web address].    Your next appointment:   Follow up after testing   The format for your next appointment:   In Person  Provider:   You may see Kate Sable, MD or one of the following Advanced Practice Providers on your designated Care Team:   Murray Hodgkins, NP Christell Faith, PA-C Cadence Kathlen Mody, [place]    Other Instructions   Important Information About Sugar         Signed, Kate Sable, MD  01/12/2022 11:09 AM    [place]

## 2022-01-12 NOTE — Patient Instructions (Signed)
Medication Instructions:   Your physician recommends that you continue on your current medications as directed. Please refer to the Current Medication list given to you today.  *If you need a refill on your cardiac medications before your next appointment, please call your pharmacy*   Testing/Procedures:  [place]     Your caregiver has ordered a Stress Test with nuclear imaging. The purpose of this test is to evaluate the blood supply to your heart muscle. This procedure is referred to as a "Non-Invasive Stress Test." This is because other than having an IV started in your vein, nothing is inserted or "invades" your body. Cardiac stress tests are done to find areas of poor blood flow to the heart by determining the extent of coronary artery disease (CAD). Some patients exercise on a treadmill, which naturally increases the blood flow to your heart, while others who are  unable to walk on a treadmill due to physical limitations have a pharmacologic/chemical stress agent called Lexiscan . This medicine will mimic walking on a treadmill by temporarily increasing your coronary blood flow.      PLEASE REPORT TO [facility] MEDICAL MALL ENTRANCE   THE VOLUNTEERS AT THE FIRST DESK WILL DIRECT YOU WHERE TO GO     *Please note: these test may take anywhere between 2-4 hours to complete       Date of Procedure:_____________________________________   Arrival Time for Procedure:______________________________    PLEASE NOTIFY THE OFFICE AT LEAST 24 HOURS IN ADVANCE IF YOU ARE UNABLE TO KEEP YOUR APPOINTMENT.  [place] 24 HOURS IN ADVANCE IF YOU ARE UNABLE TO KEEP YOUR APPOINTMENT. [phone]      How to prepare for your Myoview test:        1. Do not eat or drink after midnight  2. No caffeine for 24 hours prior to test  3. No smoking 24 hours prior to test.  4. Unless instructed otherwise, Take your medication with a small sips of water.     5.         Ladies, please do not wear dresses. Skirts or pants are appropriate. Please wear a short sleeve shirt.  6. No perfume, cologne or lotion.  7. Wear comfortable walking shoes. No heels!     Follow-Up: At [facility], you and your health needs are our priority.  As part of our continuing mission to provide you with exceptional heart care, we have created designated Provider Care Teams.  These Care Teams include your primary Cardiologist (physician) and Advanced Practice Providers (APPs -  Physician Assistants and Nurse Practitioners) who all work together to provide you with the care you need, when you need it.  We recommend signing up for the patient portal called "MyChart".  Sign up information is provided on this After Visit Summary.  MyChart is used to connect with patients for Virtual Visits (Telemedicine).  Patients are able to view lab/test results, encounter notes, upcoming appointments, etc.  Non-urgent messages can be sent to your provider as well.   To learn more about what you can do with MyChart, go to [email or web address].    Your next appointment:   Follow up after testing   The format for your next appointment:   In Person  Provider:   You may see Kate Sable, MD or one of the following Advanced Practice Providers on your designated Care Team:   Murray Hodgkins, NP Christell Faith, PA-C Cadence Kathlen Mody, [place]  Other Instructions   Important Information About Sugar

## 2022-01-13 ENCOUNTER — Other Ambulatory Visit: Payer: Self-pay | Admitting: Primary Care

## 2022-01-13 DIAGNOSIS — R221 Localized swelling, mass and lump, neck: Secondary | ICD-10-CM

## 2022-01-24 ENCOUNTER — Ambulatory Visit
Admission: RE | Admit: 2022-01-24 | Discharge: 2022-01-24 | Disposition: A | Discharge status: Home / Self Care | Payer: Medicaid Other | Source: Ambulatory Visit | Attending: Primary Care | Admitting: Primary Care

## 2022-01-24 DIAGNOSIS — R221 Localized swelling, mass and lump, neck: Secondary | ICD-10-CM | POA: Diagnosis present

## 2022-01-24 DIAGNOSIS — R072 Precordial pain: Secondary | ICD-10-CM | POA: Diagnosis present

## 2022-01-25 ENCOUNTER — Ambulatory Visit (HOSPITAL_BASED_OUTPATIENT_CLINIC_OR_DEPARTMENT_OTHER)
Admission: RE | Admit: 2022-01-25 | Discharge: 2022-01-25 | Disposition: A | Discharge status: Home / Self Care | Payer: Medicaid Other | Source: Ambulatory Visit | Attending: Cardiology | Admitting: Cardiology

## 2022-01-25 DIAGNOSIS — R072 Precordial pain: Secondary | ICD-10-CM

## 2022-01-25 LAB — NM MYOCAR MULTI W/SPECT W/WALL MOTION / EF
Base ST Depression (mm): 0 mm
LV dias vol: 75 mL (ref 46–106)
LV sys vol: 30 mL
Nuc Stress EF: 60 %
Peak HR: 130 {beats}/min
Percent HR: 72 %
Rest HR: 85 {beats}/min
Rest Nuclear Isotope Dose: 10.4 mCi
SDS: 5
SRS: 3
SSS: 8
ST Depression (mm): 0 mm
Stress Nuclear Isotope Dose: 32.6 mCi
TID: 1.05

## 2022-01-25 MED ORDER — TECHNETIUM TC 99M TETROFOSMIN IV KIT
10.0000 | PACK | Freq: Once | INTRAVENOUS | Status: AC | PRN
  Administered 2022-01-25: 10.44 via INTRAVENOUS

## 2022-01-25 MED ORDER — REGADENOSON 0.4 MG/5ML IV SOLN
0.4000 mg | Freq: Once | INTRAVENOUS | Status: AC
  Administered 2022-01-25: 0.4 mg via INTRAVENOUS

## 2022-01-25 MED ORDER — TECHNETIUM TC 99M TETROFOSMIN IV KIT
32.6300 | PACK | Freq: Once | INTRAVENOUS | Status: AC | PRN
  Administered 2022-01-25: 32.63 via INTRAVENOUS

## 2022-03-07 ENCOUNTER — Encounter: Payer: Self-pay | Admitting: Cardiology

## 2022-03-07 ENCOUNTER — Ambulatory Visit: Payer: Medicaid Other | Attending: Cardiology | Admitting: Cardiology

## 2022-03-07 VITALS — BP 106/72 | HR 81 | Ht 62.0 in | Wt 255.8 lb

## 2022-03-07 DIAGNOSIS — R072 Precordial pain: Secondary | ICD-10-CM | POA: Insufficient documentation

## 2022-03-07 NOTE — Patient Instructions (Addendum)
Medication Instructions:   Your physician has recommended you make the following change in your medication:   Start taking Wegovy:  Inject 0.25 MG into skin once a week, for 4 weeks.     Inject 0.50 MG into skin once a week, for 4 weeks.     Inject 1.0 MG into skin once a week, for 4 weeks.   Inject 1.7 MG into skin once a week, for 4 weeks.    Inject 2.4 MG into skin once a week, and continue at this dose.      *If you need a refill on your cardiac medications before your next appointment, please call your pharmacy*    Follow-Up: At [facility], you and your health needs are our priority.  As part of our continuing mission to provide you with exceptional heart care, we have created designated Provider Care Teams.  These Care Teams include your primary Cardiologist (physician) and Advanced Practice Providers (APPs -  Physician Assistants and Nurse Practitioners) who all work together to provide you with the care you need, when you need it.  We recommend signing up for the patient portal called "MyChart".  Sign up information is provided on this After Visit Summary.  MyChart is used to connect with patients for Virtual Visits (Telemedicine).  Patients are able to view lab/test results, encounter notes, upcoming appointments, etc.  Non-urgent messages can be sent to your provider as well.   To learn more about what you can do with MyChart, go to [email or web address].    Your next appointment:   6 month(s)  The format for your next appointment:   In Person  Provider:   Kate Sable, MD    Other Instructions   Important Information About Sugar

## 2022-03-07 NOTE — Progress Notes (Signed)
Cardiology Office Note:    Date:  03/07/2022   ID:  Penny Hardy, DOB 1981-01-27, MRN [id number]  PCP:  Freddy Finner, NP   [facility] Providers Cardiologist:  None     Referring MD: Freddy Finner, NP   No chief complaint on file.   History of Present Illness:    Penny Hardy is a 41 y.o. female with a hx of Crohn's disease, obesity who presents for follow-up.  Previously seen due to chest pain in the setting of taking Humira.  Previous echocardiogram was normal, Lexiscan Myoview was obtained to evaluate presence of ischemia.  She started taking different medications for Crohn's disease, has been feeling well since.  She is trying really hard to lose weight without success.  Overall doing okay.  Presents for testing results.  Prior notes Echo 12/2021 EF 55 to 60%  Past Medical History:  Diagnosis Date   Kidney infection     Past Surgical History:  Procedure Laterality Date   CESAREAN SECTION     COLONOSCOPY WITH PROPOFOL N/A 09/15/2021   Procedure: COLONOSCOPY WITH PROPOFOL;  Surgeon: Lin Landsman, MD;  Location: [facility] ENDOSCOPY;  Service: Gastroenterology;  Laterality: N/A;   FLEXIBLE SIGMOIDOSCOPY N/A 09/01/2019   Procedure: FLEXIBLE SIGMOIDOSCOPY;  Surgeon: Lin Landsman, MD;  Location: [facility] ENDOSCOPY;  Service: Gastroenterology;  Laterality: N/A;    Current Medications: Current Meds  Medication Sig   ustekinumab (STELARA) 90 MG/ML SOSY injection Inject into the skin.     Allergies:   Asa [aspirin], Ibuprofen, and Pollen extract   Social History   Socioeconomic History   Marital status: Single    Spouse name: Not on file   Number of children: Not on file   Years of education: Not on file   Highest education level: Not on file  Occupational History   Not on file  Tobacco Use   Smoking status: Former    Types: Cigarettes   Smokeless tobacco: Never  Vaping Use   Vaping Use: Never used  Substance and Sexual Activity   Alcohol use: No    Drug use: No   Sexual activity: Not Currently  Other Topics Concern   Not on file  Social History Narrative   Not on file   Social Determinants of Health   Financial Resource Strain: Not on file  Food Insecurity: Not on file  Transportation Needs: Not on file  Physical Activity: Not on file  Stress: Not on file  Social Connections: Not on file     Family History: The patient's family history is not on file.  ROS:   Please see the history of present illness.     All other systems reviewed and are negative.  EKGs/Labs/Other Studies Reviewed:    The following studies were reviewed today:   EKG:  EKG is  ordered today.  The ekg ordered today demonstrates normal sinus rhythm, normal ECG.  Recent Labs: 10/24/2021: ALT 25 01/02/2022: BUN 10; Creatinine, Ser 0.89; Hemoglobin 12.1; Platelets 403; Potassium 4.1; Sodium 138  Recent Lipid Panel No results found for: "CHOL", "TRIG", "HDL", "CHOLHDL", "VLDL", "LDLCALC", "LDLDIRECT"   Risk Assessment/Calculations:          Physical Exam:    VS:  BP 106/72   Pulse 81   Ht 5' 2"  (1.575 m)   Wt 255 lb 12.8 oz (116 kg)   LMP 02/21/2022   SpO2 98%   BMI 46.79 kg/m     Wt Readings from Last 3 Encounters:  03/07/22 255 lb 12.8 oz (116 kg)  01/12/22 257 lb 2 oz (116.6 kg)  11/29/21 257 lb 4 oz (116.7 kg)     GEN:  Well nourished, well developed in no acute distress HEENT: Normal NECK: No JVD; No carotid bruits CARDIAC: RRR, no murmurs, rubs, gallops RESPIRATORY:  Clear to auscultation without rales, wheezing or rhonchi  ABDOMEN: Soft, non-tender, non-distended MUSCULOSKELETAL:  No edema; left chest tenderness with palpation SKIN: Warm and dry NEUROLOGIC:  Alert and oriented x 3 PSYCHIATRIC:  Normal affect   ASSESSMENT:    1. Precordial pain   2. Morbid obesity ([place])     PLAN:    In order of problems listed above:  Chest pain, in the setting of taking Humira, echo and Myoview were normal.  Symptoms resolved  with new Crohn's medication.  Patient reassured. Morbid obesity, start Wegovy, continue low calorie diet, weight loss.  Follow-up in 6 months.     Medication Adjustments/Labs and Tests Ordered: Current medicines are reviewed at length with the patient today.  Concerns regarding medicines are outlined above.  Orders Placed This Encounter  Procedures   EKG 12-Lead   No orders of the defined types were placed in this encounter.   Patient Instructions  Medication Instructions:   Your physician has recommended you make the following change in your medication:   Start taking Wegovy:  Inject 0.25 MG into skin once a week, for 4 weeks.     Inject 0.50 MG into skin once a week, for 4 weeks.     Inject 1.0 MG into skin once a week, for 4 weeks.   Inject 1.7 MG into skin once a week, for 4 weeks.    Inject 2.4 MG into skin once a week, and continue at this dose.      *If you need a refill on your cardiac medications before your next appointment, please call your pharmacy*    Follow-Up: At [facility], you and your health needs are our priority.  As part of our continuing mission to provide you with exceptional heart care, we have created designated Provider Care Teams.  These Care Teams include your primary Cardiologist (physician) and Advanced Practice Providers (APPs -  Physician Assistants and Nurse Practitioners) who all work together to provide you with the care you need, when you need it.  We recommend signing up for the patient portal called "MyChart".  Sign up information is provided on this After Visit Summary.  MyChart is used to connect with patients for Virtual Visits (Telemedicine).  Patients are able to view lab/test results, encounter notes, upcoming appointments, etc.  Non-urgent messages can be sent to your provider as well.   To learn more about what you can do with MyChart, go to [email or web address].    Your next appointment:   6 month(s)  The  format for your next appointment:   In Person  Provider:   Kate Sable, MD    Other Instructions   Important Information About Sugar         Signed, Kate Sable, MD  03/07/2022 9:36 AM    [place]

## 2022-03-27 ENCOUNTER — Telehealth: Payer: Self-pay

## 2022-03-27 NOTE — Telephone Encounter (Signed)
Patient called requesting samples of Wegovy. Informed her that I would call her when we get some in. I will call the rep and request more samples.

## 2022-04-10 ENCOUNTER — Encounter: Payer: Self-pay | Admitting: Cardiology

## 2022-04-10 ENCOUNTER — Other Ambulatory Visit: Payer: Self-pay

## 2022-04-10 MED ORDER — SEMAGLUTIDE-WEIGHT MANAGEMENT 0.5 MG/0.5ML ~~LOC~~ SOAJ: 0.5000 mg | SUBCUTANEOUS | 0 refills | Status: AC

## 2022-04-10 MED ORDER — SEMAGLUTIDE-WEIGHT MANAGEMENT 1 MG/0.5ML ~~LOC~~ SOAJ: 1.0000 mg | SUBCUTANEOUS | 0 refills | Status: AC

## 2022-04-10 NOTE — Progress Notes (Signed)
See MyChart messages.

## 2022-04-11 ENCOUNTER — Ambulatory Visit: Payer: Medicaid Other | Admitting: Gastroenterology

## 2022-04-12 NOTE — Telephone Encounter (Signed)
See MyChart thread. Closing encounter.

## 2022-04-17 ENCOUNTER — Telehealth: Payer: Self-pay | Admitting: Cardiology

## 2022-04-17 NOTE — Telephone Encounter (Signed)
Pt calling to f/u on medication, Wegovy. She states that she has not heard back from nurse regarding this matter . Please advise

## 2022-04-17 NOTE — Telephone Encounter (Signed)
Called patient and informed her that Insurance is not going to cover the medication per telephone conversation I had with the pharmacist. Patient verbalized understanding and was grateful for the call back.

## 2022-09-11 ENCOUNTER — Ambulatory Visit: Payer: Medicaid Other | Attending: Cardiology | Admitting: Cardiology

## 2022-09-11 ENCOUNTER — Other Ambulatory Visit: Payer: Self-pay

## 2022-09-11 ENCOUNTER — Encounter: Payer: Self-pay | Admitting: Cardiology

## 2022-09-11 DIAGNOSIS — R072 Precordial pain: Secondary | ICD-10-CM

## 2022-09-11 MED ORDER — SEMAGLUTIDE-WEIGHT MANAGEMENT 0.5 MG/0.5ML ~~LOC~~ SOAJ: 0.5000 mg | SUBCUTANEOUS | 0 refills | Status: DC

## 2022-09-11 MED ORDER — SEMAGLUTIDE-WEIGHT MANAGEMENT 0.25 MG/0.5ML ~~LOC~~ SOAJ
0.2500 mg | SUBCUTANEOUS | 0 refills | Status: AC
  Filled 2022-09-11: qty 2, 28d supply, fill #0

## 2022-09-11 MED ORDER — SEMAGLUTIDE-WEIGHT MANAGEMENT 0.5 MG/0.5ML ~~LOC~~ SOAJ
0.5000 mg | SUBCUTANEOUS | 0 refills | Status: AC
  Filled 2022-09-11: qty 2, 28d supply, fill #0

## 2022-09-11 MED ORDER — SEMAGLUTIDE-WEIGHT MANAGEMENT 0.25 MG/0.5ML ~~LOC~~ SOAJ: 0.2500 mg | SUBCUTANEOUS | 0 refills | Status: DC

## 2022-09-11 NOTE — Progress Notes (Signed)
Cardiology Office Note:    Date:  09/11/2022   ID:  Penny Hardy, DOB 1980/12/04, MRN [id number]  PCP:  Freddy Finner, NP   [facility] Providers Cardiologist:  Kate Sable, MD     Referring MD: Freddy Finner, NP   Chief Complaint  Patient presents with   Follow-up    6 month f/u, swelling in legs/feet/hands, weight gain    History of Present Illness:    Penny Hardy is a 42 y.o. female with a hx of Crohn's disease, obesity who presents for follow-up.  Previously seen due to chest pain in the setting of taking Humira.    Chest pains have stopped since taking Humira, workup with echo and Lexiscan Myoview were all reviewed.  Has been gaining weight, was given samples of Wegovy which help with weight loss.  Currently out of samples.  Denies chest pain, had some leg edema improved after taking over-the-counter diuretics.  Prior notes Echo 12/2021 EF 55 to 60% Lexiscan Myoview.  01/2022 no ischemia.  Past Medical History:  Diagnosis Date   Kidney infection     Past Surgical History:  Procedure Laterality Date   CESAREAN SECTION     COLONOSCOPY WITH PROPOFOL N/A 09/15/2021   Procedure: COLONOSCOPY WITH PROPOFOL;  Surgeon: Lin Landsman, MD;  Location: [facility] ENDOSCOPY;  Service: Gastroenterology;  Laterality: N/A;   FLEXIBLE SIGMOIDOSCOPY N/A 09/01/2019   Procedure: FLEXIBLE SIGMOIDOSCOPY;  Surgeon: Lin Landsman, MD;  Location: [facility] ENDOSCOPY;  Service: Gastroenterology;  Laterality: N/A;    Current Medications: Current Meds  Medication Sig   Hyoscyamine Sulfate SL 0.125 MG SUBL Place 0.0125 mg under the tongue daily.   [START ON 10/10/2022] Semaglutide-Weight Management 0.5 MG/0.5ML SOAJ Inject 0.5 mg into the skin once a week for 28 days.   ustekinumab (STELARA) 90 MG/ML SOSY injection Inject into the skin.   [DISCONTINUED] Semaglutide-Weight Management 0.25 MG/0.5ML SOAJ Inject 0.25 mg into the skin once a week for 28 days.   [DISCONTINUED]  Semaglutide-Weight Management 0.5 MG/0.5ML SOAJ Inject 0.5 mg into the skin once a week for 28 days.     Allergies:   Asa [aspirin], Ibuprofen, and Pollen extract   Social History   Socioeconomic History   Marital status: Single    Spouse name: Not on file   Number of children: Not on file   Years of education: Not on file   Highest education level: Not on file  Occupational History   Not on file  Tobacco Use   Smoking status: Former    Types: Cigarettes   Smokeless tobacco: Never  Vaping Use   Vaping Use: Never used  Substance and Sexual Activity   Alcohol use: No   Drug use: No   Sexual activity: Not Currently  Other Topics Concern   Not on file  Social History Narrative   Not on file   Social Determinants of Health   Financial Resource Strain: Not on file  Food Insecurity: Not on file  Transportation Needs: Not on file  Physical Activity: Not on file  Stress: Not on file  Social Connections: Not on file     Family History: The patient's family history is not on file.  ROS:   Please see the history of present illness.     All other systems reviewed and are negative.  EKGs/Labs/Other Studies Reviewed:    The following studies were reviewed today:   EKG:  EKG is  ordered today.  The ekg ordered today demonstrates normal  sinus rhythm, normal ECG.  Recent Labs: 10/24/2021: ALT 25 01/02/2022: BUN 10; Creatinine, Ser 0.89; Hemoglobin 12.1; Platelets 403; Potassium 4.1; Sodium 138  Recent Lipid Panel No results found for: "CHOL", "TRIG", "HDL", "CHOLHDL", "VLDL", "LDLCALC", "LDLDIRECT"   Risk Assessment/Calculations:          Physical Exam:    VS:  BP 114/78 (BP Location: Left Arm, Patient Position: Sitting, Cuff Size: Normal)   Pulse 78   Ht '5\' 2"'$  (1.575 m)   Wt 281 lb 9.6 oz (127.7 kg)   SpO2 98%   BMI 51.51 kg/m     Wt Readings from Last 3 Encounters:  09/11/22 281 lb 9.6 oz (127.7 kg)  03/07/22 255 lb 12.8 oz (116 kg)  01/12/22 257 lb 2 oz  (116.6 kg)     GEN:  Well nourished, well developed in no acute distress HEENT: Normal NECK: No JVD; No carotid bruits CARDIAC: RRR, no murmurs, rubs, gallops RESPIRATORY:  Clear to auscultation without rales, wheezing or rhonchi  ABDOMEN: Soft, non-tender, non-distended MUSCULOSKELETAL:  No edema; left chest tenderness with palpation SKIN: Warm and dry NEUROLOGIC:  Alert and oriented x 3 PSYCHIATRIC:  Normal affect   ASSESSMENT:    1. Morbid obesity ([place])   2. Precordial pain     PLAN:    In order of problems listed above:  Morbid obesity, low-calorie diet, exercise, weight loss.  Prescribed [id number], refer patient to dietitian/nutritional services. Chest pain, resolved since stopping Humira.  Echo and Myoview normal.  Follow-up in 6 months.     Medication Adjustments/Labs and Tests Ordered: Current medicines are reviewed at length with the patient today.  Concerns regarding medicines are outlined above.  Orders Placed This Encounter  Procedures   Ambulatory referral to Nutrition and Diabetic Education   EKG 12-Lead   Meds ordered this encounter  Medications   DISCONTD: Semaglutide-Weight Management 0.25 MG/0.5ML SOAJ    Sig: Inject 0.25 mg into the skin once a week for 28 days.    Dispense:  2 mL    Refill:  0   DISCONTD: Semaglutide-Weight Management 0.5 MG/0.5ML SOAJ    Sig: Inject 0.5 mg into the skin once a week for 28 days.    Dispense:  2 mL    Refill:  0   Semaglutide-Weight Management 0.25 MG/0.5ML SOAJ    Sig: Inject 0.25 mg into the skin once a week for 28 days.    Dispense:  2 mL    Refill:  0   Semaglutide-Weight Management 0.5 MG/0.5ML SOAJ    Sig: Inject 0.5 mg into the skin once a week for 28 days.    Dispense:  2 mL    Refill:  0    Patient Instructions  Medication Instructions:   Start taking Wegovy  Month 1: 0.25 mg once a week.  Month 2: 0.5 mg once a week. (Call or send [place] a MyChart message when you are on week 2, so we can send your  next dose in for you).  Month 3: 1 mg once a week.  Month 4: 1.7 mg once a week.  Month 5 and beyond: 2.4 mg once a week (maintenance dose)  *If you need a refill on your cardiac medications before your next appointment, please call your pharmacy*   Lab Work:  None Ordered  If you have labs (blood work) drawn today and your tests are completely normal, you will receive your results only by: [place] (if you have MyChart) OR A paper  copy in the mail If you have any lab test that is abnormal or we need to change your treatment, we will call you to review the results.   Testing/Procedures:  None Ordered   Follow-Up: At [facility], you and your health needs are our priority.  As part of our continuing mission to provide you with exceptional heart care, we have created designated Provider Care Teams.  These Care Teams include your primary Cardiologist (physician) and Advanced Practice Providers (APPs -  Physician Assistants and Nurse Practitioners) who all work together to provide you with the care you need, when you need it.  We recommend signing up for the patient portal called "MyChart".  Sign up information is provided on this After Visit Summary.  MyChart is used to connect with patients for Virtual Visits (Telemedicine).  Patients are able to view lab/test results, encounter notes, upcoming appointments, etc.  Non-urgent messages can be sent to your provider as well.   To learn more about what you can do with MyChart, go to [email or web address].    Your next appointment:   6 month(s)  Provider:   You may see Kate Sable, MD or one of the following Advanced Practice Providers on your designated Care Team:   Murray Hodgkins, NP Christell Faith, PA-C Cadence Kathlen Mody, PA-C Gerrie Nordmann, NP    Signed, Kate Sable, MD  09/11/2022 9:36 AM    [place]

## 2022-09-11 NOTE — Patient Instructions (Signed)
Medication Instructions:   Start taking Wegovy  Month 1: 0.25 mg once a week.  Month 2: 0.5 mg once a week. (Call or send [place] a MyChart message when you are on week 2, so we can send your next dose in for you).  Month 3: 1 mg once a week.  Month 4: 1.7 mg once a week.  Month 5 and beyond: 2.4 mg once a week (maintenance dose)  *If you need a refill on your cardiac medications before your next appointment, please call your pharmacy*   Lab Work:  None Ordered  If you have labs (blood work) drawn today and your tests are completely normal, you will receive your results only by: [place] (if you have MyChart) OR A paper copy in the mail If you have any lab test that is abnormal or we need to change your treatment, we will call you to review the results.   Testing/Procedures:  None Ordered   Follow-Up: At [facility], you and your health needs are our priority.  As part of our continuing mission to provide you with exceptional heart care, we have created designated Provider Care Teams.  These Care Teams include your primary Cardiologist (physician) and Advanced Practice Providers (APPs -  Physician Assistants and Nurse Practitioners) who all work together to provide you with the care you need, when you need it.  We recommend signing up for the patient portal called "MyChart".  Sign up information is provided on this After Visit Summary.  MyChart is used to connect with patients for Virtual Visits (Telemedicine).  Patients are able to view lab/test results, encounter notes, upcoming appointments, etc.  Non-urgent messages can be sent to your provider as well.   To learn more about what you can do with MyChart, go to [email or web address].    Your next appointment:   6 month(s)  Provider:   You may see Kate Sable, MD or one of the following Advanced Practice Providers on your designated Care Team:   Murray Hodgkins, NP Christell Faith, PA-C Cadence Kathlen Mody,  PA-C Gerrie Nordmann, NP

## 2022-10-16 ENCOUNTER — Other Ambulatory Visit: Payer: Self-pay | Admitting: Primary Care

## 2022-10-16 DIAGNOSIS — Z1231 Encounter for screening mammogram for malignant neoplasm of breast: Secondary | ICD-10-CM

## 2022-11-08 ENCOUNTER — Ambulatory Visit
Admission: RE | Admit: 2022-11-08 | Discharge: 2022-11-08 | Disposition: A | Discharge status: Home / Self Care | Payer: Medicaid Other | Source: Ambulatory Visit | Attending: Primary Care | Admitting: Primary Care

## 2022-11-08 DIAGNOSIS — Z1231 Encounter for screening mammogram for malignant neoplasm of breast: Secondary | ICD-10-CM | POA: Insufficient documentation

## 2022-12-06 ENCOUNTER — Ambulatory Visit: Payer: Medicaid Other | Admitting: Dietician

## 2023-05-04 IMAGING — US US ABDOMEN LIMITED
1 series · 15 of 25 positions shown · non-contrast
Comparison: MRI abdomen 09/12/2021.  CT abdomen 07/25/2019

CLINICAL DATA: Epigastric pain.

EXAM:
ULTRASOUND ABDOMEN LIMITED RIGHT UPPER QUADRANT

[Series 1: us abdomen limited ruq · 15 of 33 slices shown]
[im 1/33]
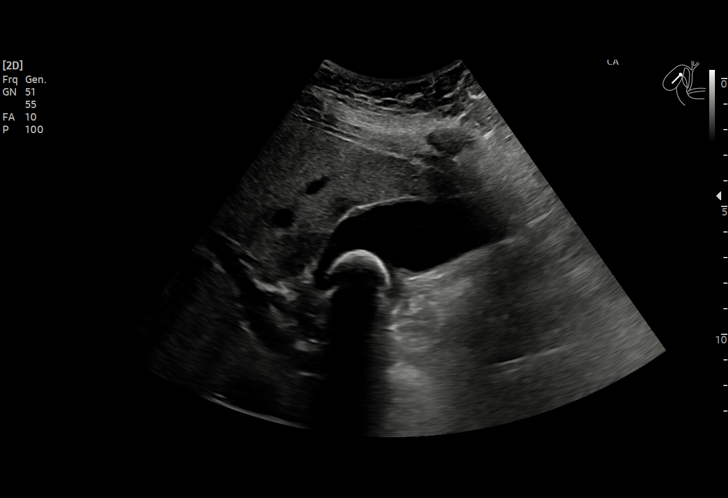
[im 3/33]
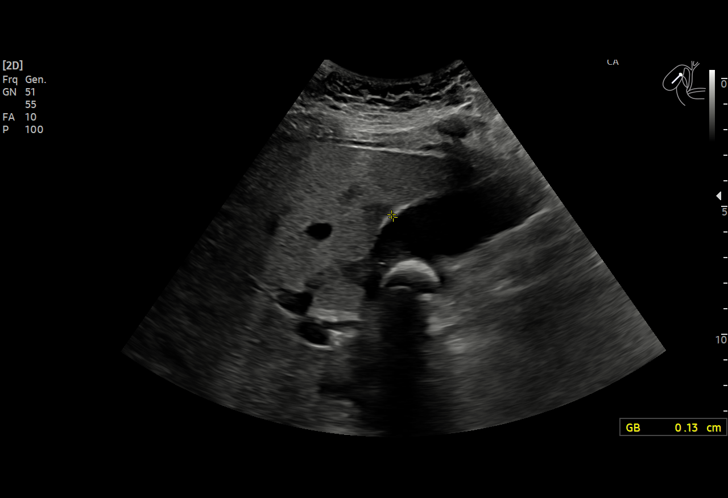
[im 6/33]
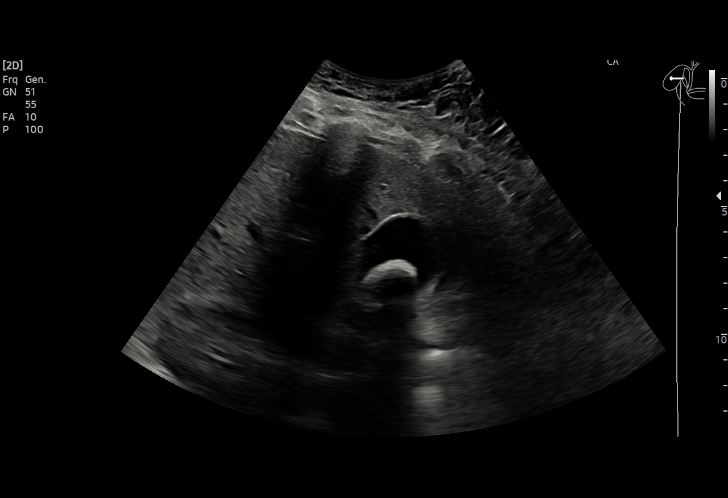
[im 7/33]
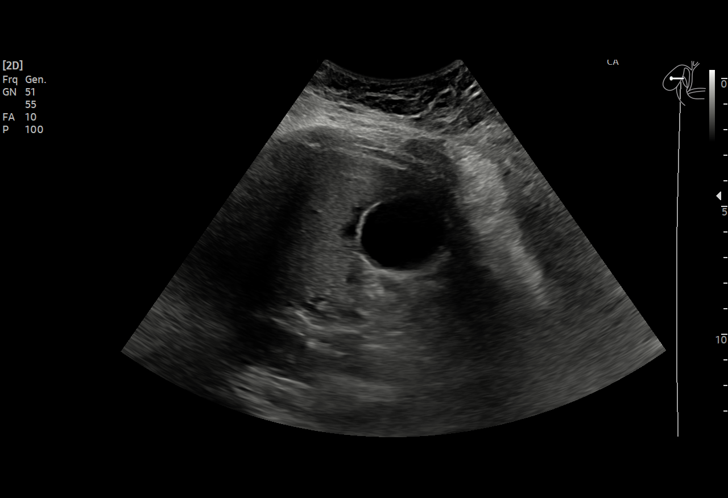
[im 10/33]
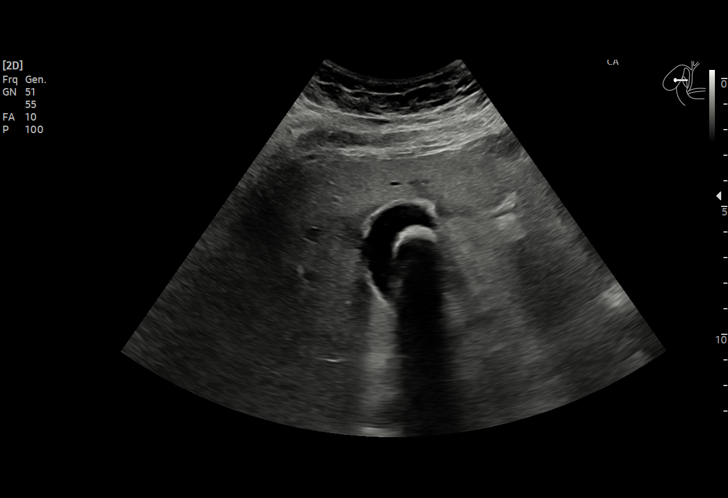
[im 13/33]
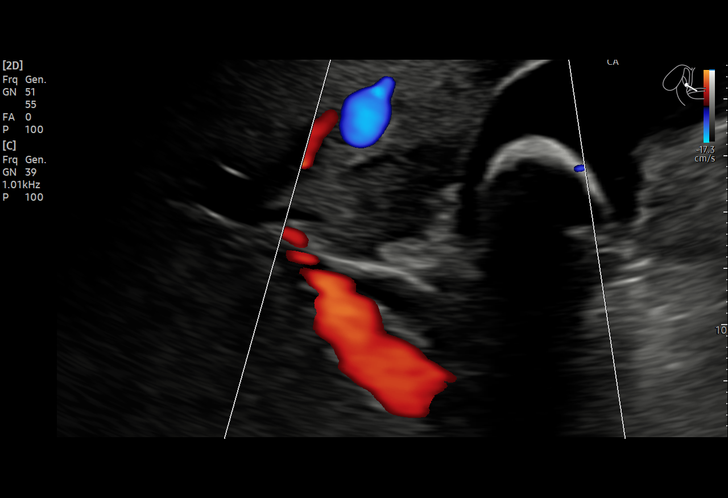
[im 14/33]
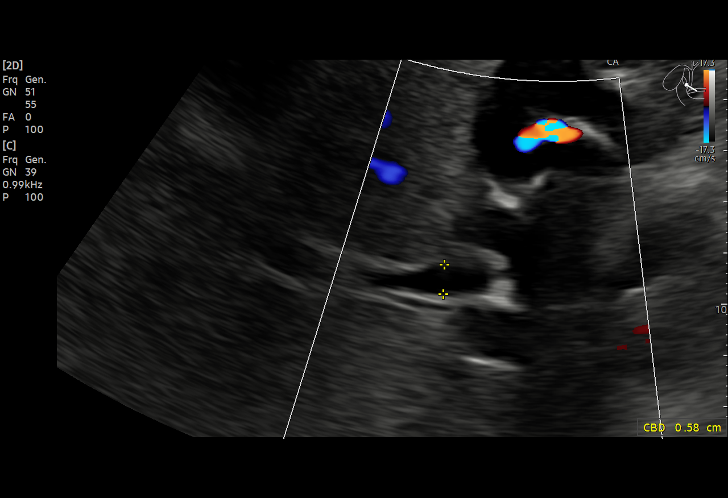
[im 17/33]
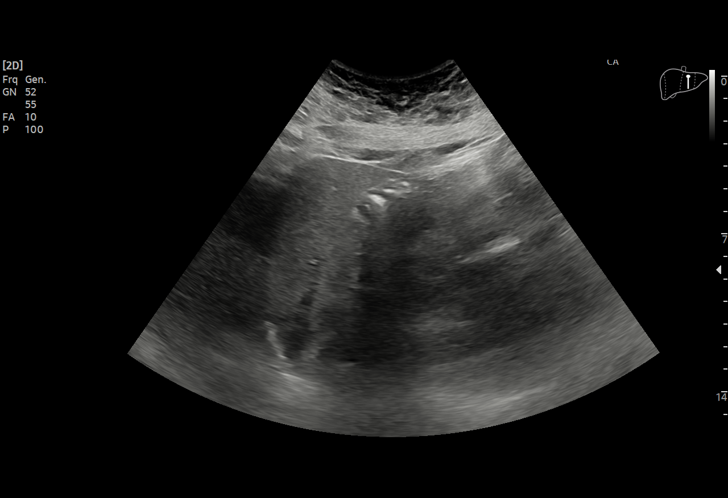
[im 19/33]
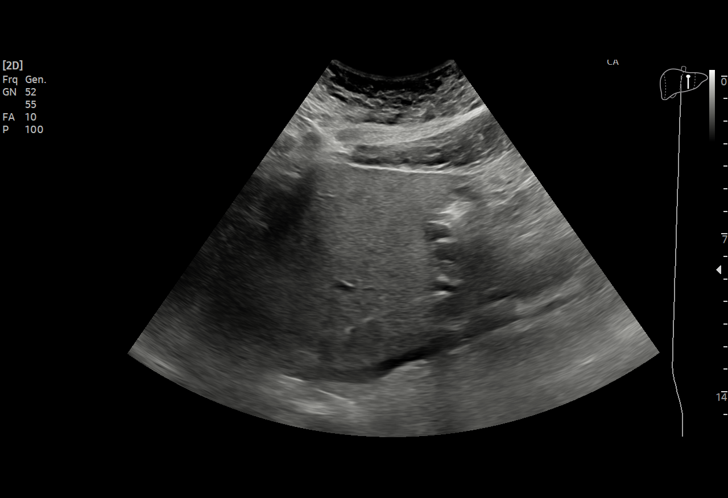
[im 21/33]
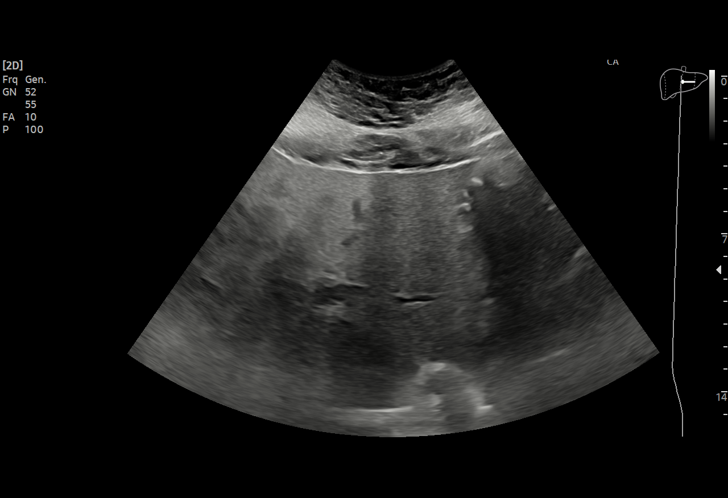
[im 23/33]
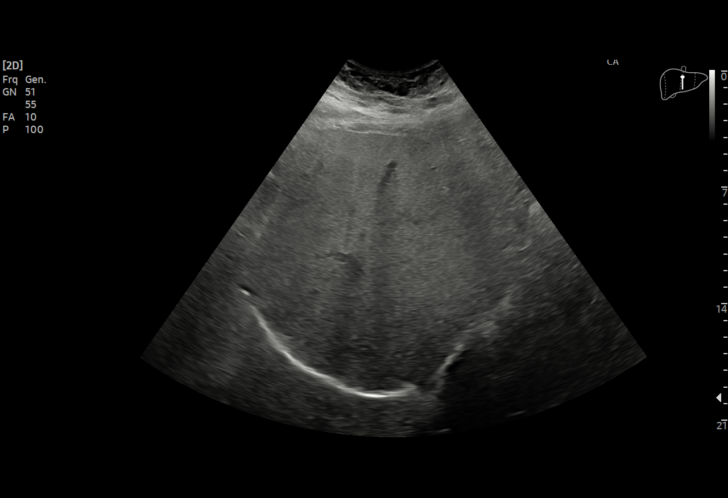
[im 26/33]
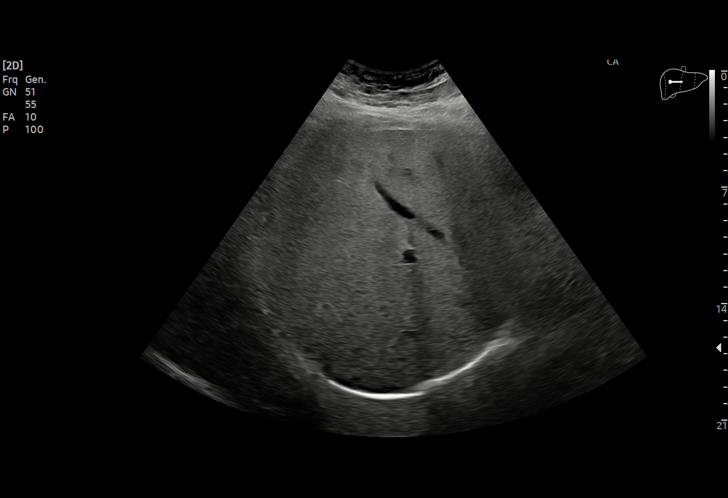
[im 27/33]
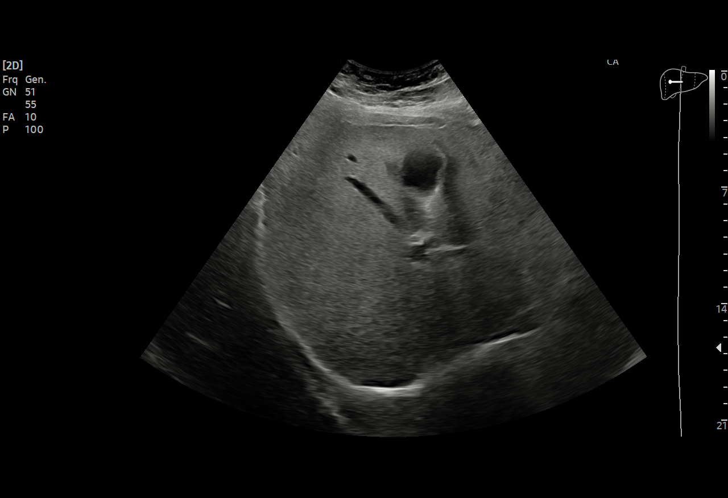
[im 30/33]
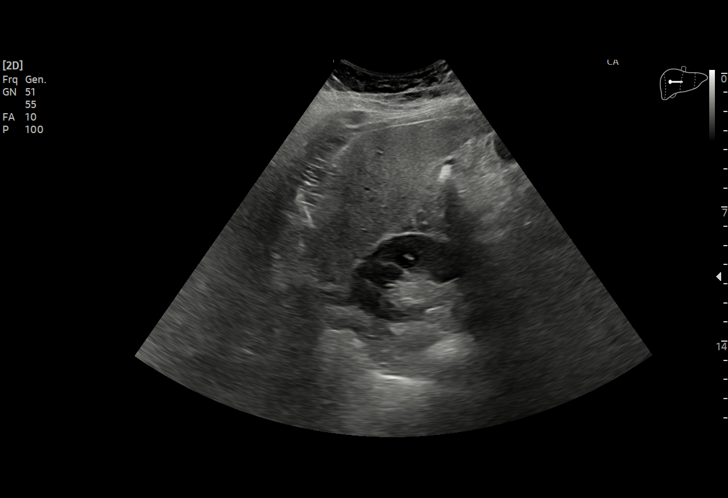
[im 33/33]
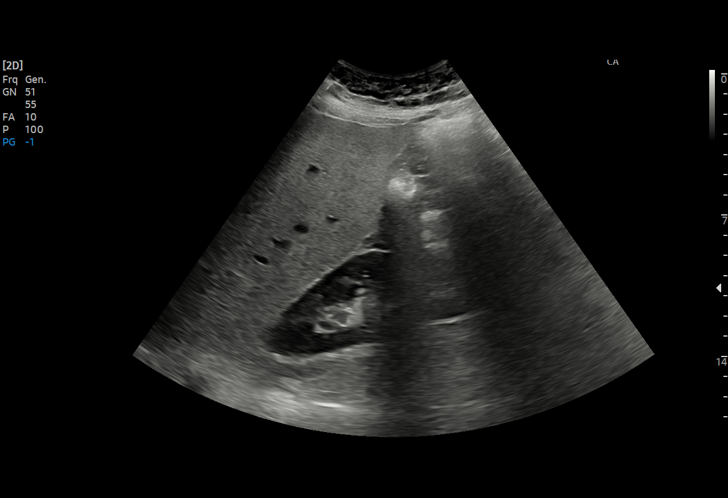

[15 of 25 positions shown; findings below may reference images not displayed]

FINDINGS: Gallbladder:

Large gallstone measuring up to 2.4 cm. Negative sonographic Murphy
sign. No gallbladder wall thickening.

Common bile duct:

Diameter: 6.0 mm.

Liver:

Fatty infiltration liver. Focal fatty sparing adjacent to the
gallbladder fossa. Portal vein is patent on color Doppler imaging
with normal direction of blood flow towards the liver.

Other: None.
IMPRESSION: Large gallstone. No gallbladder wall thickening or biliary
dilatation.

Fatty liver

## 2023-09-04 ENCOUNTER — Emergency Department
Admission: EM | Admit: 2023-09-04 | Discharge: 2023-09-04 | Disposition: A | Discharge status: Home / Self Care | Payer: Medicaid Other | Attending: Emergency Medicine | Admitting: Emergency Medicine

## 2023-09-04 ENCOUNTER — Other Ambulatory Visit: Payer: Self-pay

## 2023-09-04 ENCOUNTER — Emergency Department: Payer: Medicaid Other

## 2023-09-04 DIAGNOSIS — G43109 Migraine with aura, not intractable, without status migrainosus: Secondary | ICD-10-CM | POA: Insufficient documentation

## 2023-09-04 DIAGNOSIS — R519 Headache, unspecified: Secondary | ICD-10-CM | POA: Diagnosis present

## 2023-09-04 HISTORY — DX: Crohn's disease, unspecified, without complications: K50.90

## 2023-09-04 HISTORY — DX: Diverticulosis of intestine, part unspecified, without perforation or abscess without bleeding: K57.90

## 2023-09-04 LAB — COMPREHENSIVE METABOLIC PANEL
ALT: 22 U/L (ref 0–44)
AST: 19 U/L (ref 15–41)
Albumin: 3.8 g/dL (ref 3.5–5.0)
Alkaline Phosphatase: 74 U/L (ref 38–126)
Anion gap: 8 (ref 5–15)
BUN: 11 mg/dL (ref 6–20)
CO2: 25 mmol/L (ref 22–32)
Calcium: 8.9 mg/dL (ref 8.9–10.3)
Chloride: 105 mmol/L (ref 98–111)
Creatinine, Ser: 0.58 mg/dL (ref 0.44–1.00)
GFR, Estimated: >60 mL/min (ref >60)
Glucose, Bld: 88 mg/dL (ref 70–99)
Potassium: 3.5 mmol/L (ref 3.5–5.1)
Sodium: 138 mmol/L (ref 135–145)
Total Bilirubin: 0.4 mg/dL (ref 0.0–1.2)
Total Protein: 7.3 g/dL (ref 6.5–8.1)

## 2023-09-04 LAB — DIFFERENTIAL
Abs Immature Granulocytes: 0.02 10*3/uL (ref 0.00–0.07)
Basophils Absolute: 0.1 10*3/uL (ref 0.0–0.1)
Basophils Relative: 1 %
Eosinophils Absolute: 0.2 10*3/uL (ref 0.0–0.5)
Eosinophils Relative: 2 %
Immature Granulocytes: 0 %
Lymphocytes Relative: 24 %
Lymphs Abs: 1.8 10*3/uL (ref 0.7–4.0)
Monocytes Absolute: 0.5 10*3/uL (ref 0.1–1.0)
Monocytes Relative: 7 %
Neutro Abs: 4.9 10*3/uL (ref 1.7–7.7)
Neutrophils Relative %: 66 %

## 2023-09-04 LAB — CBC
HCT: 38.9 % (ref 36.0–46.0)
Hemoglobin: 12.8 g/dL (ref 12.0–15.0)
MCH: 31 pg (ref 26.0–34.0)
MCHC: 32.9 g/dL (ref 30.0–36.0)
MCV: 94.2 fL (ref 80.0–100.0)
Platelets: 329 10*3/uL (ref 150–400)
RBC: 4.13 MIL/uL (ref 3.87–5.11)
RDW: 12.8 % (ref 11.5–15.5)
WBC: 7.5 10*3/uL (ref 4.0–10.5)
nRBC: 0 % (ref 0.0–0.2)

## 2023-09-04 LAB — PROTIME-INR
INR: 1 (ref 0.8–1.2)
Prothrombin Time: 13.4 s (ref 11.4–15.2)

## 2023-09-04 LAB — ETHANOL: Alcohol, Ethyl (B): <10 mg/dL (ref <10)

## 2023-09-04 LAB — APTT: aPTT: 30 s (ref 24–36)

## 2023-09-04 MED ORDER — PROCHLORPERAZINE EDISYLATE 10 MG/2ML IJ SOLN
5.0000 mg | Freq: Once | INTRAMUSCULAR | Status: AC
  Administered 2023-09-04: 5 mg via INTRAVENOUS
  Filled 2023-09-04: qty 2

## 2023-09-04 MED ORDER — MORPHINE SULFATE (PF) 4 MG/ML IV SOLN
4.0000 mg | Freq: Once | INTRAVENOUS | Status: AC
  Administered 2023-09-04: 4 mg via INTRAVENOUS
  Filled 2023-09-04: qty 1

## 2023-09-04 MED ORDER — DIPHENHYDRAMINE HCL 50 MG/ML IJ SOLN
25.0000 mg | Freq: Once | INTRAMUSCULAR | Status: AC
  Administered 2023-09-04: 25 mg via INTRAVENOUS
  Filled 2023-09-04: qty 1

## 2023-09-04 MED ORDER — SODIUM CHLORIDE 0.9 % IV BOLUS
1000.0000 mL | Freq: Once | INTRAVENOUS | Status: AC
  Administered 2023-09-04: 1000 mL via INTRAVENOUS

## 2023-09-04 NOTE — ED Notes (Signed)
 Pt up to use restroom, steady on feet, no assistance required

## 2023-09-04 NOTE — ED Notes (Signed)
 Pt stating no longer in pain, just tired.

## 2023-09-04 NOTE — ED Triage Notes (Signed)
 Pt to ED for HA since 5 days from front of forehead across top of head and down to mid back. Hx migraines and used to take meds but not anymore. Both eyes are sore and "sometimes they both get blurry and have fluids come out of them". When asked, endorses intermittent numbness to L face since same timeframe. Face is symmetrical, speech clear, no arm drift.

## 2023-09-04 NOTE — ED Notes (Signed)
 Pt returned to bed without issue. Fluids restarted.

## 2023-09-04 NOTE — Discharge Instructions (Signed)
 Please follow-up with your primary care provider as needed for ongoing treatment outpatient.

## 2023-09-04 NOTE — ED Provider Notes (Signed)
 [facility] Provider Note    Event Date/Time   First MD Initiated Contact with Patient 09/04/23 1327     (approximate)   History   Headache   HPI MADELLINE ESHBACH is a 43 y.o. female presenting today for headache.  Patient states for the past 5 days she has had an on-and-off headache as well as intermittent nausea.  She started to feel some tingling to the left side of her face today.  Otherwise denied numbness or weakness to any of her extremities.  No speech changes.  No obvious vision changes.  The headache was slow building and not acute onset.  Does not describe it as the worst headache of her life.  Otherwise denies any other acute symptoms at this time.  Does note prior history of migraines but not on any medication for this.     Physical Exam   Triage Vital Signs: ED Triage Vitals  Encounter Vitals Group     BP 09/04/23 1214 124/82     Systolic BP Percentile --      Diastolic BP Percentile --      Pulse Rate 09/04/23 1214 71     Resp 09/04/23 1214 20     Temp 09/04/23 1217 98.4 F (36.9 C)     Temp Source 09/04/23 1214 Oral     SpO2 09/04/23 1214 100 %     Weight 09/04/23 1216 248 lb (112.5 kg)     Height 09/04/23 1216 5\' 2"  (1.575 m)     Head Circumference --      Peak Flow --      Pain Score 09/04/23 1212 10     Pain Loc --      Pain Education --      Exclude from Growth Chart --     Most recent vital signs: Vitals:   09/04/23 1217 09/04/23 1405  BP:  103/70  Pulse:  67  Resp:  18  Temp: 98.4 F (36.9 C)   SpO2:  100%   I have reviewed the vital signs. General:  Awake, alert, no acute distress. Head:  Normocephalic, Atraumatic. EENT:  PERRL, EOMI, Oral mucosa pink and moist, Neck is supple. Cardiovascular: Regular rate, 2+ distal pulses. Respiratory:  Normal respiratory effort, symmetrical expansion, no distress.   Extremities:  Moving all four extremities through full ROM without pain.   Neuro:  Alert and oriented.   Interacting appropriately.  Very slight subjective tingling to the left cheek but not throughout the other left side of her face.  Otherwise cranial nerves II through XII intact.  Sensation equal and intact throughout bilateral upper and lower extremities.  Strength equal and intact throughout bilateral upper and lower extremities. Skin:  Warm, dry, no rash.   Psych: Appropriate affect.    ED Results / Procedures / Treatments   Labs (all labs ordered are listed, but only abnormal results are displayed) Labs Reviewed  PROTIME-INR  APTT  CBC  DIFFERENTIAL  COMPREHENSIVE METABOLIC PANEL  ETHANOL  CBG MONITORING, ED  POC URINE PREG, ED     EKG My EKG interpretation: Rate of 71, normal sinus rhythm, normal axis, normal intervals.  No acute ST elevations or depressions   RADIOLOGY Independently interpreted CT head with no acute pathology   PROCEDURES:  Critical Care performed: No  Procedures   MEDICATIONS ORDERED IN ED: Medications  sodium chloride 0.9 % bolus 1,000 mL (1,000 mLs Intravenous New Bag/Given 09/04/23 1400)  prochlorperazine (COMPAZINE) injection 5 mg (  5 mg Intravenous Given 09/04/23 1402)  diphenhydrAMINE (BENADRYL) injection 25 mg (25 mg Intravenous Given 09/04/23 1406)  morphine (PF) 4 MG/ML injection 4 mg (4 mg Intravenous Given 09/04/23 1403)     IMPRESSION / MDM / ASSESSMENT AND PLAN / ED COURSE  I reviewed the triage vital signs and the nursing notes.                              Differential diagnosis includes, but is not limited to, migraine, IIH, low suspicion for CVA or ICH  Patient's presentation is most consistent with acute complicated illness / injury requiring diagnostic workup.  Patient is a 43 year old female presenting today for headache and nausea.  Physical exam largely unremarkable.  She has slight tingling to the left cheek but it is not spread throughout the left face and no other numbness symptoms elsewhere.  This was not an acute  onset or thunderclap headache with a low suspicion for subarachnoid hemorrhage.  No obvious stroke symptoms elsewise.  Vital signs stable.  Laboratory workup all reassuring.  CT imaging of head ordered in triage was negative for acute pathology.  Will treat with migraine cocktail.  Patient was reassessed following migraine cocktail with symptomatic resolution.  Feeling much better at this time and safe for discharge.  Told to follow-up with PCP and given strict return precautions.  Clinical Course as of 09/04/23 1514  Tue Sep 04, 2023  1512 Reassessed with no ongoing symptoms.  Will discharge at this time for suspected migraine [DW]    Clinical Course User Index [DW] Janith Lima, MD     FINAL CLINICAL IMPRESSION(S) / ED DIAGNOSES   Final diagnoses:  Migraine with aura and without status migrainosus, not intractable     Rx / DC Orders   ED Discharge Orders     None        Note:  This document was prepared using Dragon voice recognition software and may include unintentional dictation errors.   Janith Lima, MD 09/04/23 [phone]

## 2023-10-04 ENCOUNTER — Other Ambulatory Visit: Payer: Self-pay | Admitting: Primary Care

## 2023-10-04 DIAGNOSIS — Z1231 Encounter for screening mammogram for malignant neoplasm of breast: Secondary | ICD-10-CM

## 2023-10-21 NOTE — Progress Notes (Deleted)
   Cardiology Clinic Note   Date: 10/21/2023 ID: MARIYANNA MUCHA, DOB Aug 16, 1980, MRN [id number]  Primary Cardiologist:  Constancia Delton, MD  Chief Complaint   Penny Hardy is a 43 y.o. female who presents to the clinic today for ***  Patient Profile   Penny Hardy is followed by *** for the history outlined below.      Past medical history significant for: Chest pain. Echo 12/27/2021: EF 55 to 60%.  No RWMA.  Normal diastolic parameters.  Normal RV size/function.  Mild MR. Nuclear stress test 01/25/2022: Normal, low risk study. Crohn's.  In summary, patient was first evaluated by Dr. Junnie Olives on 11/22/2021 for chest pain.  Patient reported left-sided chest pain that began the month prior after her second dose of Humira.  Pain was described as sharp radiating to her back.  PCP advised her to stop Humira.  Pain was reproducible with palpation.  She underwent echo and nuclear stress test for further evaluation as detailed above.  Patient was last seen in the office by Dr. Junnie Olives on 09/11/2022.  She was working on weight loss with [id number].  She was referred to nutritional services for further assistance with weight loss.     History of Present Illness    Today, patient ***  Chest pain Echo June 2023 showed normal LV/RV function and mild MR.  Nuclear stress test July 2023 was a normal low risk study.  Patient*** -Continue***  ROS: All other systems reviewed and are otherwise negative except as noted in History of Present Illness.  EKGs/Labs Reviewed        09/04/2023: ALT 22; AST 19; BUN 11; Creatinine, Ser 0.58; Potassium 3.5; Sodium 138   09/04/2023: Hemoglobin 12.8; WBC 7.5   No results found for requested labs within last 365 days.   No results found for requested labs within last 365 days.  ***  Risk Assessment/Calculations    {Does this patient have ATRIAL FIBRILLATION?:[phone]} No BP recorded.  {Refresh Note OR Click here to enter BP  :1}***         Physical Exam    VS:  There were no vitals taken for this visit. , BMI There is no height or weight on file to calculate BMI.  GEN: Well nourished, well developed, in no acute distress. Neck: No JVD or carotid bruits. Cardiac: *** RRR. No murmurs. No rubs or gallops.   Respiratory:  Respirations regular and unlabored. Clear to auscultation without rales, wheezing or rhonchi. GI: Soft, nontender, nondistended. Extremities: Radials/DP/PT 2+ and equal bilaterally. No clubbing or cyanosis. No edema ***  Skin: Warm and dry, no rash. Neuro: Strength intact.  Assessment & Plan   ***  Disposition: ***     {Are you ordering a CV Procedure (e.g. stress test, cath, DCCV, TEE, etc)?   Press F2        :[id number]}   Signed, Lonell Rives. Bryler Dibble, DNP, NP-C

## 2023-10-25 ENCOUNTER — Ambulatory Visit: Attending: Student | Admitting: Student

## 2023-11-14 ENCOUNTER — Encounter

## 2023-11-28 ENCOUNTER — Ambulatory Visit: Attending: Cardiology | Admitting: Cardiology

## 2024-02-15 ENCOUNTER — Ambulatory Visit: Admitting: Cardiology

## 2024-02-21 ENCOUNTER — Encounter: Payer: Self-pay | Admitting: Cardiology

## 2024-02-21 ENCOUNTER — Ambulatory Visit: Attending: Cardiology | Admitting: Cardiology

## 2024-02-21 VITALS — BP 96/69 | HR 82 | Ht 64.0 in | Wt 229.8 lb

## 2024-02-21 DIAGNOSIS — R072 Precordial pain: Secondary | ICD-10-CM | POA: Diagnosis present

## 2024-02-21 DIAGNOSIS — Z6839 Body mass index (BMI) 39.0-39.9, adult: Secondary | ICD-10-CM | POA: Diagnosis present

## 2024-02-21 NOTE — Progress Notes (Signed)
 Cardiology Office Note:    Date:  02/21/2024   ID:  Penny Hardy, DOB Sep 19, 1980, MRN [id number]  PCP:  Era Harlene, NP   [facility] Providers Cardiologist:  Redell Cave, MD     Referring MD: Era Harlene, NP   Chief Complaint  Patient presents with   Follow-up    6 month follow up visit. Patient states that she had a little bit of chest pain on yesterday and the day before. Meds reviewed.     History of Present Illness:    Penny Hardy is a 43 y.o. female with a hx of Crohn's disease, obesity who presents for follow-up.   Overall doing okay, started on Wegovy  with good effect, has lost about 50 pounds since last visit.  Feels well, has occasional chest discomfort reproducible with stretching, arms and palpation.  Prior notes Echo 12/2021 EF 55 to 60% Lexiscan  Myoview .  01/2022 no ischemia.  Past Medical History:  Diagnosis Date   Crohn's disease (HCC)    Diverticulosis    Kidney infection     Past Surgical History:  Procedure Laterality Date   CESAREAN SECTION     COLONOSCOPY WITH PROPOFOL  N/A 09/15/2021   Procedure: COLONOSCOPY WITH PROPOFOL ;  Surgeon: Unk Corinn Skiff, MD;  Location: [facility] ENDOSCOPY;  Service: Gastroenterology;  Laterality: N/A;   FLEXIBLE SIGMOIDOSCOPY N/A 09/01/2019   Procedure: FLEXIBLE SIGMOIDOSCOPY;  Surgeon: Unk Corinn Skiff, MD;  Location: [facility] ENDOSCOPY;  Service: Gastroenterology;  Laterality: N/A;    Current Medications: Current Meds  Medication Sig   Hyoscyamine Sulfate SL 0.125 MG SUBL Place 0.0125 mg under the tongue daily. (Patient taking differently: Place 0.0125 mg under the tongue as needed.)   WEGOVY  2.4 MG/0.75ML SOAJ SQ injection Inject 2.4 mg into the skin once a week.     Allergies:   Asa [aspirin], Ibuprofen, and Pollen extract   Social History   Socioeconomic History   Marital status: Single    Spouse name: Not on file   Number of children: Not on file   Years of education: Not on file    Highest education level: Not on file  Occupational History   Not on file  Tobacco Use   Smoking status: Former    Types: Cigarettes   Smokeless tobacco: Never  Vaping Use   Vaping status: Never Used  Substance and Sexual Activity   Alcohol use: No   Drug use: No   Sexual activity: Not Currently  Other Topics Concern   Not on file  Social History Narrative   Not on file   Social Drivers of Health   Financial Resource Strain: Not on file  Food Insecurity: Not on file  Transportation Needs: Not on file  Physical Activity: Not on file  Stress: Not on file  Social Connections: Not on file     Family History: The patient's family history is not on file.  ROS:   Please see the history of present illness.     All other systems reviewed and are negative.  EKGs/Labs/Other Studies Reviewed:    The following studies were reviewed today:   EKG:  EKG is  ordered today.  The ekg ordered today demonstrates normal sinus rhythm, normal ECG.  Recent Labs: 09/04/2023: ALT 22; BUN 11; Creatinine, Ser 0.58; Hemoglobin 12.8; Platelets 329; Potassium 3.5; Sodium 138  Recent Lipid Panel No results found for: CHOL, TRIG, HDL, CHOLHDL, VLDL, LDLCALC, LDLDIRECT   Risk Assessment/Calculations:          Physical  Exam:    VS:  BP 96/69   Pulse 82   Ht 5' 4 (1.626 m)   Wt 229 lb 12.8 oz (104.2 kg)   SpO2 98%   BMI 39.45 kg/m     Wt Readings from Last 3 Encounters:  02/21/24 229 lb 12.8 oz (104.2 kg)  09/04/23 248 lb (112.5 kg)  09/11/22 281 lb 9.6 oz (127.7 kg)     GEN:  Well nourished, well developed in no acute distress HEENT: Normal NECK: No JVD; No carotid bruits CARDIAC: RRR, no murmurs, rubs, gallops RESPIRATORY:  Clear to auscultation without rales, wheezing or rhonchi  ABDOMEN: Soft, non-tender, non-distended MUSCULOSKELETAL:  No edema; left chest tenderness with palpation SKIN: Warm and dry NEUROLOGIC:  Alert and oriented x 3 PSYCHIATRIC:  Normal  affect   ASSESSMENT:    1. BMI 39.0-39.9,adult   2. Precordial pain    PLAN:    In order of problems listed above:  obesity, low-calorie diet, exercise, weight loss.  Continue Wegovy . Chest pain, noncardiac, reproducible with palpation suggesting musculoskeletal etiology.  Previous echo and Lexiscan  Myoview  were normal.  Follow-up as needed.     Medication Adjustments/Labs and Tests Ordered: Current medicines are reviewed at length with the patient today.  Concerns regarding medicines are outlined above.  Orders Placed This Encounter  Procedures   EKG 12-Lead   No orders of the defined types were placed in this encounter.   Patient Instructions  Medication Instructions:  Your physician recommends that you continue on your current medications as directed. Please refer to the Current Medication list given to you today.   *If you need a refill on your cardiac medications before your next appointment, please call your pharmacy*  Lab Work: No labs ordered today  If you have labs (blood work) drawn today and your tests are completely normal, you will receive your results only by: MyChart Message (if you have MyChart) OR A paper copy in the mail If you have any lab test that is abnormal or we need to change your treatment, we will call you to review the results.  Testing/Procedures: No test ordered today   Follow-Up: At [facility], you and your health needs are our priority.  As part of our continuing mission to provide you with exceptional heart care, our providers are all part of one team.  This team includes your primary Cardiologist (physician) and Advanced Practice Providers or APPs (Physician Assistants and Nurse Practitioners) who all work together to provide you with the care you need, when you need it.  Your next appointment:   As needed         Signed, Redell Cave, MD  02/21/2024 11:51 AM     [facility]

## 2024-02-21 NOTE — Patient Instructions (Signed)
